# Patient Record
Sex: Female | Born: 1950 | Race: White | Hispanic: No | Marital: Married | Smoking: Never smoker
Health system: Southern US, Community
[De-identification: ages and names within clinical notes are randomized; demographics above are authoritative.]

## PROBLEM LIST (undated history)

## (undated) DIAGNOSIS — E119 Type 2 diabetes mellitus without complications: Secondary | ICD-10-CM

## (undated) DIAGNOSIS — K219 Gastro-esophageal reflux disease without esophagitis: Secondary | ICD-10-CM

## (undated) DIAGNOSIS — C801 Malignant (primary) neoplasm, unspecified: Secondary | ICD-10-CM

## (undated) DIAGNOSIS — I10 Essential (primary) hypertension: Secondary | ICD-10-CM

## (undated) DIAGNOSIS — K7460 Unspecified cirrhosis of liver: Secondary | ICD-10-CM

## (undated) HISTORY — PX: TUBAL LIGATION: SHX77

---

## 2006-07-29 ENCOUNTER — Ambulatory Visit: Payer: Self-pay

## 2007-08-04 ENCOUNTER — Ambulatory Visit: Payer: Self-pay

## 2008-08-08 ENCOUNTER — Ambulatory Visit: Payer: Self-pay | Admitting: Obstetrics and Gynecology

## 2009-09-11 ENCOUNTER — Ambulatory Visit: Payer: Self-pay

## 2010-09-19 ENCOUNTER — Ambulatory Visit: Payer: Self-pay

## 2011-09-23 ENCOUNTER — Ambulatory Visit: Payer: Self-pay

## 2012-09-28 ENCOUNTER — Ambulatory Visit: Payer: Self-pay

## 2012-10-18 ENCOUNTER — Ambulatory Visit: Payer: Self-pay

## 2012-11-11 ENCOUNTER — Ambulatory Visit: Payer: Self-pay | Admitting: Surgery

## 2012-11-11 HISTORY — PX: BREAST BIOPSY: SHX20

## 2012-11-12 LAB — PATHOLOGY REPORT

## 2013-09-29 ENCOUNTER — Ambulatory Visit: Payer: Self-pay

## 2014-09-05 ENCOUNTER — Other Ambulatory Visit: Payer: Self-pay | Admitting: Internal Medicine

## 2014-09-05 DIAGNOSIS — Z139 Encounter for screening, unspecified: Secondary | ICD-10-CM

## 2014-10-03 ENCOUNTER — Ambulatory Visit
Admission: RE | Admit: 2014-10-03 | Discharge: 2014-10-03 | Disposition: A | Discharge status: Home / Self Care | Payer: BLUE CROSS/BLUE SHIELD | Source: Ambulatory Visit | Attending: Internal Medicine | Admitting: Internal Medicine

## 2014-10-03 DIAGNOSIS — Z1231 Encounter for screening mammogram for malignant neoplasm of breast: Secondary | ICD-10-CM | POA: Diagnosis present

## 2014-10-03 DIAGNOSIS — Z139 Encounter for screening, unspecified: Secondary | ICD-10-CM

## 2015-08-23 ENCOUNTER — Other Ambulatory Visit: Payer: Self-pay | Admitting: Internal Medicine

## 2015-09-06 ENCOUNTER — Other Ambulatory Visit: Payer: Self-pay | Admitting: Obstetrics and Gynecology

## 2015-09-06 DIAGNOSIS — Z1231 Encounter for screening mammogram for malignant neoplasm of breast: Secondary | ICD-10-CM

## 2015-10-09 ENCOUNTER — Ambulatory Visit
Admission: RE | Admit: 2015-10-09 | Discharge: 2015-10-09 | Disposition: A | Discharge status: Home / Self Care | Payer: Medicare Other | Source: Ambulatory Visit | Attending: Obstetrics and Gynecology | Admitting: Obstetrics and Gynecology

## 2015-10-09 ENCOUNTER — Other Ambulatory Visit: Payer: Self-pay | Admitting: Obstetrics and Gynecology

## 2015-10-09 DIAGNOSIS — Z1231 Encounter for screening mammogram for malignant neoplasm of breast: Secondary | ICD-10-CM

## 2016-07-17 ENCOUNTER — Other Ambulatory Visit: Payer: Self-pay | Admitting: Internal Medicine

## 2016-07-17 DIAGNOSIS — D72819 Decreased white blood cell count, unspecified: Secondary | ICD-10-CM

## 2016-07-17 DIAGNOSIS — R945 Abnormal results of liver function studies: Secondary | ICD-10-CM

## 2016-07-17 DIAGNOSIS — R7989 Other specified abnormal findings of blood chemistry: Secondary | ICD-10-CM

## 2016-07-17 DIAGNOSIS — D696 Thrombocytopenia, unspecified: Secondary | ICD-10-CM

## 2016-07-17 DIAGNOSIS — E119 Type 2 diabetes mellitus without complications: Secondary | ICD-10-CM

## 2016-07-29 ENCOUNTER — Ambulatory Visit
Admission: RE | Admit: 2016-07-29 | Discharge: 2016-07-29 | Disposition: A | Discharge status: Home / Self Care | Payer: Medicare Other | Source: Ambulatory Visit | Attending: Internal Medicine | Admitting: Internal Medicine

## 2016-07-29 DIAGNOSIS — D72819 Decreased white blood cell count, unspecified: Secondary | ICD-10-CM | POA: Insufficient documentation

## 2016-07-29 DIAGNOSIS — R161 Splenomegaly, not elsewhere classified: Secondary | ICD-10-CM | POA: Diagnosis not present

## 2016-07-29 DIAGNOSIS — R945 Abnormal results of liver function studies: Secondary | ICD-10-CM | POA: Insufficient documentation

## 2016-07-29 DIAGNOSIS — D696 Thrombocytopenia, unspecified: Secondary | ICD-10-CM | POA: Diagnosis not present

## 2016-07-29 DIAGNOSIS — K7460 Unspecified cirrhosis of liver: Secondary | ICD-10-CM | POA: Diagnosis not present

## 2016-07-29 DIAGNOSIS — E119 Type 2 diabetes mellitus without complications: Secondary | ICD-10-CM | POA: Diagnosis present

## 2016-07-29 DIAGNOSIS — R7989 Other specified abnormal findings of blood chemistry: Secondary | ICD-10-CM

## 2016-07-29 DIAGNOSIS — I700 Atherosclerosis of aorta: Secondary | ICD-10-CM | POA: Diagnosis not present

## 2016-07-29 HISTORY — DX: Essential (primary) hypertension: I10

## 2016-07-29 HISTORY — DX: Type 2 diabetes mellitus without complications: E11.9

## 2016-07-29 MED ORDER — IOPAMIDOL (ISOVUE-300) INJECTION 61%
100.0000 mL | Freq: Once | INTRAVENOUS | Status: AC | PRN
  Administered 2016-07-29: 100 mL via INTRAVENOUS

## 2016-09-11 ENCOUNTER — Other Ambulatory Visit: Payer: Self-pay | Admitting: Obstetrics and Gynecology

## 2016-09-11 DIAGNOSIS — Z1231 Encounter for screening mammogram for malignant neoplasm of breast: Secondary | ICD-10-CM

## 2016-10-13 ENCOUNTER — Ambulatory Visit
Admission: RE | Admit: 2016-10-13 | Discharge: 2016-10-13 | Disposition: A | Discharge status: Home / Self Care | Payer: Medicare Other | Source: Ambulatory Visit | Attending: Obstetrics and Gynecology | Admitting: Obstetrics and Gynecology

## 2016-10-13 DIAGNOSIS — Z1231 Encounter for screening mammogram for malignant neoplasm of breast: Secondary | ICD-10-CM | POA: Insufficient documentation

## 2016-10-13 HISTORY — DX: Malignant (primary) neoplasm, unspecified: C80.1

## 2016-10-24 ENCOUNTER — Other Ambulatory Visit: Payer: Self-pay | Admitting: Obstetrics and Gynecology

## 2016-10-24 DIAGNOSIS — R928 Other abnormal and inconclusive findings on diagnostic imaging of breast: Secondary | ICD-10-CM

## 2016-10-28 ENCOUNTER — Ambulatory Visit
Admission: RE | Admit: 2016-10-28 | Discharge: 2016-10-28 | Disposition: A | Discharge status: Home / Self Care | Payer: Medicare Other | Source: Ambulatory Visit | Attending: Obstetrics and Gynecology | Admitting: Obstetrics and Gynecology

## 2016-10-28 DIAGNOSIS — R928 Other abnormal and inconclusive findings on diagnostic imaging of breast: Secondary | ICD-10-CM | POA: Insufficient documentation

## 2016-12-09 ENCOUNTER — Encounter: Payer: Self-pay | Admitting: *Deleted

## 2016-12-10 ENCOUNTER — Encounter: Payer: Self-pay | Admitting: *Deleted

## 2016-12-10 ENCOUNTER — Ambulatory Visit
Admission: RE | Admit: 2016-12-10 | Discharge: 2016-12-10 | Disposition: A | Discharge status: Home / Self Care | Payer: Medicare Other | Source: Ambulatory Visit | Attending: Unknown Physician Specialty | Admitting: Unknown Physician Specialty

## 2016-12-10 ENCOUNTER — Ambulatory Visit: Payer: Medicare Other | Admitting: Anesthesiology

## 2016-12-10 ENCOUNTER — Encounter
Admission: RE | Disposition: A | Discharge status: Home / Self Care | Payer: Self-pay | Source: Ambulatory Visit | Attending: Unknown Physician Specialty

## 2016-12-10 DIAGNOSIS — K648 Other hemorrhoids: Secondary | ICD-10-CM | POA: Insufficient documentation

## 2016-12-10 DIAGNOSIS — K2970 Gastritis, unspecified, without bleeding: Secondary | ICD-10-CM | POA: Insufficient documentation

## 2016-12-10 DIAGNOSIS — E119 Type 2 diabetes mellitus without complications: Secondary | ICD-10-CM | POA: Diagnosis not present

## 2016-12-10 DIAGNOSIS — Z9851 Tubal ligation status: Secondary | ICD-10-CM | POA: Insufficient documentation

## 2016-12-10 DIAGNOSIS — I10 Essential (primary) hypertension: Secondary | ICD-10-CM | POA: Diagnosis not present

## 2016-12-10 DIAGNOSIS — K635 Polyp of colon: Secondary | ICD-10-CM | POA: Insufficient documentation

## 2016-12-10 DIAGNOSIS — Z1211 Encounter for screening for malignant neoplasm of colon: Secondary | ICD-10-CM | POA: Diagnosis not present

## 2016-12-10 DIAGNOSIS — Z9889 Other specified postprocedural states: Secondary | ICD-10-CM | POA: Insufficient documentation

## 2016-12-10 DIAGNOSIS — Z7984 Long term (current) use of oral hypoglycemic drugs: Secondary | ICD-10-CM | POA: Diagnosis not present

## 2016-12-10 DIAGNOSIS — Z79899 Other long term (current) drug therapy: Secondary | ICD-10-CM | POA: Diagnosis not present

## 2016-12-10 DIAGNOSIS — Z8589 Personal history of malignant neoplasm of other organs and systems: Secondary | ICD-10-CM | POA: Insufficient documentation

## 2016-12-10 DIAGNOSIS — I8500 Esophageal varices without bleeding: Secondary | ICD-10-CM | POA: Insufficient documentation

## 2016-12-10 DIAGNOSIS — K7460 Unspecified cirrhosis of liver: Secondary | ICD-10-CM | POA: Insufficient documentation

## 2016-12-10 HISTORY — PX: ESOPHAGOGASTRODUODENOSCOPY (EGD) WITH PROPOFOL: SHX5813

## 2016-12-10 HISTORY — PX: COLONOSCOPY WITH PROPOFOL: SHX5780

## 2016-12-10 HISTORY — DX: Unspecified cirrhosis of liver: K74.60

## 2016-12-10 LAB — GLUCOSE, CAPILLARY: GLUCOSE-CAPILLARY: 118 mg/dL — AB (ref 65–99)

## 2016-12-10 SURGERY — ESOPHAGOGASTRODUODENOSCOPY (EGD) WITH PROPOFOL
Anesthesia: General

## 2016-12-10 MED ORDER — PROPOFOL 10 MG/ML IV BOLUS
INTRAVENOUS | Status: DC | PRN
  Administered 2016-12-10: 30 mg via INTRAVENOUS

## 2016-12-10 MED ORDER — PHENYLEPHRINE HCL 10 MG/ML IJ SOLN
INTRAMUSCULAR | Status: AC
  Filled 2016-12-10: qty 1

## 2016-12-10 MED ORDER — FENTANYL CITRATE (PF) 100 MCG/2ML IJ SOLN
INTRAMUSCULAR | Status: DC | PRN
  Administered 2016-12-10 (×2): 50 ug via INTRAVENOUS

## 2016-12-10 MED ORDER — MIDAZOLAM HCL 2 MG/2ML IJ SOLN
INTRAMUSCULAR | Status: DC | PRN
  Administered 2016-12-10: 2 mg via INTRAVENOUS

## 2016-12-10 MED ORDER — PROPOFOL 500 MG/50ML IV EMUL
INTRAVENOUS | Status: AC
  Filled 2016-12-10: qty 50

## 2016-12-10 MED ORDER — MIDAZOLAM HCL 2 MG/2ML IJ SOLN
INTRAMUSCULAR | Status: AC
  Filled 2016-12-10: qty 2

## 2016-12-10 MED ORDER — FENTANYL CITRATE (PF) 100 MCG/2ML IJ SOLN
INTRAMUSCULAR | Status: AC
  Filled 2016-12-10: qty 2

## 2016-12-10 MED ORDER — PROPOFOL 500 MG/50ML IV EMUL
INTRAVENOUS | Status: DC | PRN
  Administered 2016-12-10: 120 ug/kg/min via INTRAVENOUS

## 2016-12-10 MED ORDER — LIDOCAINE HCL (PF) 2 % IJ SOLN
INTRAMUSCULAR | Status: AC
  Filled 2016-12-10: qty 10

## 2016-12-10 MED ORDER — SODIUM CHLORIDE 0.9 % IV SOLN: INTRAVENOUS | Status: DC

## 2016-12-10 MED ORDER — PHENYLEPHRINE HCL 10 MG/ML IJ SOLN
INTRAMUSCULAR | Status: DC | PRN
  Administered 2016-12-10: 100 ug via INTRAVENOUS

## 2016-12-10 NOTE — Anesthesia Post-op Follow-up Note (Signed)
Anesthesia QCDR form completed.        

## 2016-12-10 NOTE — H&P (Signed)
Primary Care Physician:  Tracie Harrier, MD Primary Gastroenterologist:  Dr. Vira Agar  Pre-Procedure History & Physical: HPI:  Deanna Escobar is a 66 y.o. female is here for an endoscopy and colonoscopy.   Past Medical History:  Diagnosis Date  . Cancer ([place])    skin ca  . Cirrhosis, non-alcoholic ([place])   . Diabetes mellitus without complication ([place])    Pt takes Metformin.  Marland Kitchen Hypertension     Past Surgical History:  Procedure Laterality Date  . BREAST BIOPSY Right 11/11/12   bx/clip-neg  . TUBAL LIGATION      Prior to Admission medications   Medication Sig Start Date End Date Taking? Authorizing Provider  alendronate (FOSAMAX) 70 MG tablet Take 70 mg by mouth once a week. Take with a full glass of water on an empty stomach.   Yes [provider]  Cholecalciferol 2000 units TABS Take by mouth.   Yes [provider]  citalopram (CELEXA) 20 MG tablet Take 20 mg by mouth daily.   Yes [provider]  clobetasol ointment (TEMOVATE) 7.04 % Apply 1 application topically 2 (two) times daily.   Yes [provider]  furosemide (LASIX) 20 MG tablet Take 20 mg by mouth.   Yes [provider]  gentamicin ointment (GARAMYCIN) 0.1 % Apply 1 application topically 3 (three) times daily.   Yes [provider]  glimepiride (AMARYL) 2 MG tablet Take 2 mg by mouth daily with breakfast.   Yes [provider]  HM OMEGA-3-6-9 FATTY ACIDS CAPS Take by mouth.   Yes [provider]  hydroxychloroquine (PLAQUENIL) 200 MG tablet Take by mouth daily.   Yes [provider]  lisinopril (PRINIVIL,ZESTRIL) 20 MG tablet Take 20 mg by mouth daily.   Yes [provider]  metFORMIN (GLUCOPHAGE) 500 MG tablet Take by mouth 2 (two) times daily with a meal.   Yes [provider]  vitamin B-12 (CYANOCOBALAMIN) 1000 MCG tablet Take 1,000 mcg by mouth daily.   Yes [provider]    Allergies as of 09/18/2016   . (No Known Allergies)    Family History  Problem Relation Age of Onset  . Breast cancer Neg Hx     Social History   Social History  . Marital status: Married    Spouse name: N/A  . Number of children: N/A  . Years of education: N/A   Occupational History  . Not on file.   Social History Main Topics  . Smoking status: Never Smoker  . Smokeless tobacco: Never Used  . Alcohol use No  . Drug use: No  . Sexual activity: Not on file   Other Topics Concern  . Not on file   Social History Narrative  . No narrative on file    Review of Systems: See HPI, otherwise negative ROS  Physical Exam: BP (!) 131/54   Pulse 80   Temp 97.9 F (36.6 C) (Tympanic)   Resp 18   Ht 5' 1"  (1.549 m)   Wt 81.6 kg (180 lb)   SpO2 99%   BMI 34.01 kg/m  General:   Alert,  pleasant and cooperative in NAD Head:  Normocephalic and atraumatic. Neck:  Supple; no masses or thyromegaly. Lungs:  Clear throughout to auscultation.    Heart:  Regular rate and rhythm. Abdomen:  Soft, nontender and nondistended. Normal bowel sounds, without guarding, and without rebound.   Neurologic:  Alert and  oriented x4;  grossly normal neurologically. SKIN with spider  angioma.  Impression/Plan: Deanna Escobar is here for an endoscopy and colonoscopy to be performed for colon cancer screening and EGD to screen for varices.  Risks, benefits, limitations, and alternatives regarding  endoscopy and colonoscopy have been reviewed with the patient.  Questions have been answered.  All parties agreeable.   Gaylyn Cheers, MD  12/10/2016, 7:30 AM

## 2016-12-10 NOTE — Op Note (Signed)
[facility] Gastroenterology Patient Name: Deanna Escobar Procedure Date: 12/10/2016 7:30 AM MRN: [id number] Account #: [id number] Date of Birth: 1951-02-18 Admit Type: Outpatient Age: 66 Room: [facility] ENDO ROOM 3 Gender: Female Note Status: Finalized Procedure:            Colonoscopy Indications:          Screening for colorectal malignant neoplasm Providers:            Manya Silvas, MD Referring MD:         Tracie Harrier, MD (Referring MD) Medicines:            Propofol per Anesthesia Complications:        No immediate complications. Procedure:            Pre-Anesthesia Assessment:                       - After reviewing the risks and benefits, the patient                        was deemed in satisfactory condition to undergo the                        procedure.                       After obtaining informed consent, the colonoscope was                        passed under direct vision. Throughout the procedure,                        the patient's blood pressure, pulse, and oxygen                        saturations were monitored continuously. The                        Colonoscope was introduced through the anus and                        advanced to the the cecum, identified by appendiceal                        orifice and ileocecal valve. The colonoscopy was                        performed without difficulty. The patient tolerated the                        procedure well. The quality of the bowel preparation                        was good. Findings:      A diminutive polyp was found in the cecum. The polyp was sessile. The       polyp was removed with a cold biopsy forceps. Resection and retrieval       were complete.      Internal hemorrhoids were found during endoscopy. The hemorrhoids were       small and Grade I (internal hemorrhoids that do not prolapse).      The exam was otherwise  without abnormality. Impression:           - One diminutive  polyp in the cecum, removed with a                        cold biopsy forceps. Resected and retrieved.                       - Internal hemorrhoids.                       - The examination was otherwise normal. Recommendation:       - Await pathology results. Manya Silvas, MD 12/10/2016 8:13:39 AM This report has been signed electronically. Number of Addenda: 0 Note Initiated On: 12/10/2016 7:30 AM Scope Withdrawal Time: 0 hours 13 minutes 42 seconds  Total Procedure Duration: 0 hours 21 minutes 14 seconds       [facility]

## 2016-12-10 NOTE — Transfer of Care (Signed)
Immediate Anesthesia Transfer of Care Note  Patient: Deanna Escobar  Procedure(s) Performed: ESOPHAGOGASTRODUODENOSCOPY (EGD) WITH PROPOFOL (N/A ) COLONOSCOPY WITH PROPOFOL (N/A )  Patient Location: PACU  Anesthesia Type:General  Level of Consciousness: awake  Airway & Oxygen Therapy: Patient Spontanous Breathing and Patient connected to nasal cannula oxygen  Post-op Assessment: Report given to RN and Post -op Vital signs reviewed and stable  Post vital signs: Reviewed  Last Vitals:  Vitals:   12/10/16 0652  BP: (!) 131/54  Pulse: 80  Resp: 18  Temp: 36.6 C  SpO2: 99%    Last Pain:  Vitals:   12/10/16 0652  TempSrc: Tympanic         Complications: No apparent anesthesia complications

## 2016-12-10 NOTE — Op Note (Signed)
[facility] Gastroenterology Patient Name: Deanna Escobar Procedure Date: 12/10/2016 7:31 AM MRN: [id number] Account #: [id number] Date of Birth: [birth year] Admit Type: Outpatient Age: 66 Room: [facility] ENDO ROOM 3 Gender: Female Note Status: Finalized Procedure:            Upper GI endoscopy Indications:          Cirrhosis with suspected esophageal varices Providers:            Manya Silvas, MD Referring MD:         Tracie Harrier, MD (Referring MD) Medicines:            Propofol per Anesthesia Complications:        No immediate complications. Procedure:            Pre-Anesthesia Assessment:                       - After reviewing the risks and benefits, the patient                        was deemed in satisfactory condition to undergo the                        procedure.                       After obtaining informed consent, the endoscope was                        passed under direct vision. Throughout the procedure,                        the patient's blood pressure, pulse, and oxygen                        saturations were monitored continuously. The Endoscope                        was introduced through the mouth, and advanced to the                        second part of duodenum. The upper GI endoscopy was                        accomplished without difficulty. The patient tolerated                        the procedure well. Findings:      Grade 1-2 varices were found in the lower third of the esophagus. No       evidence of bleeding.      Patchy moderate inflammation characterized by erosions was found in the       gastric antrum.      Patchy mildly erythematous mucosa without bleeding was found in the       gastric antrum.      The examined duodenum was normal. Impression:           - Grade II esophageal varices.                       - Gastritis.                       -  Erythematous mucosa in the antrum.                       - Normal examined  duodenum.                       - No specimens collected. Recommendation:       Start PPI like Omeprazole and consider beta blocker                        prophylaxis.                       - The findings and recommendations were discussed with                        the patient's family. Manya Silvas, MD 12/10/2016 7:47:54 AM This report has been signed electronically. Number of Addenda: 0 Note Initiated On: 12/10/2016 7:31 AM      [facility]

## 2016-12-10 NOTE — Anesthesia Postprocedure Evaluation (Signed)
Anesthesia Post Note  Patient: JENNEFER KOPP  Procedure(s) Performed: ESOPHAGOGASTRODUODENOSCOPY (EGD) WITH PROPOFOL (N/A ) COLONOSCOPY WITH PROPOFOL (N/A )  Patient location during evaluation: PACU Anesthesia Type: General Level of consciousness: awake Pain management: pain level controlled Vital Signs Assessment: post-procedure vital signs reviewed and stable Respiratory status: nonlabored ventilation Cardiovascular status: stable Anesthetic complications: no     Last Vitals:  Vitals:   12/10/16 0834 12/10/16 0844  BP: (!) 113/55 118/60  Pulse: 82 71  Resp: 15 12  Temp:    SpO2: 99% 99%    Last Pain:  Vitals:   12/10/16 0814  TempSrc: Tympanic                 VAN STAVEREN,Taylee Gunnells

## 2016-12-10 NOTE — Anesthesia Preprocedure Evaluation (Signed)
Anesthesia Evaluation  Patient identified by MRN, date of birth, ID band Patient awake    Reviewed: Allergy & Precautions, NPO status , Patient's Chart, lab work & pertinent test results  Airway Mallampati: II       Dental  (+) Teeth Intact   Pulmonary neg pulmonary ROS,    breath sounds clear to auscultation       Cardiovascular Exercise Tolerance: Good hypertension, Pt. on medications  Rhythm:Regular Rate:Normal     Neuro/Psych negative neurological ROS     GI/Hepatic negative GI ROS, Neg liver ROS, (+) Cirrhosis       ,   Endo/Other  diabetes, Type 2, Oral Hypoglycemic Agents  Renal/GU negative Renal ROS  negative genitourinary   Musculoskeletal negative musculoskeletal ROS (+)   Abdominal Normal abdominal exam  (+)   Peds negative pediatric ROS (+)  Hematology negative hematology ROS (+)   Anesthesia Other Findings   Reproductive/Obstetrics                             Anesthesia Physical Anesthesia Plan  ASA: II  Anesthesia Plan: General   Post-op Pain Management:    Induction: Intravenous  PONV Risk Score and Plan: 0  Airway Management Planned: Natural Airway and Nasal Cannula  Additional Equipment:   Intra-op Plan:   Post-operative Plan:   Informed Consent: I have reviewed the patients History and Physical, chart, labs and discussed the procedure including the risks, benefits and alternatives for the proposed anesthesia with the patient or authorized representative who has indicated his/her understanding and acceptance.     Plan Discussed with: Surgeon  Anesthesia Plan Comments:         Anesthesia Quick Evaluation

## 2016-12-11 ENCOUNTER — Encounter: Payer: Self-pay | Admitting: Unknown Physician Specialty

## 2016-12-11 LAB — SURGICAL PATHOLOGY

## 2017-02-07 HISTORY — PX: LIVER BIOPSY: SHX301

## 2017-02-11 ENCOUNTER — Other Ambulatory Visit: Payer: Self-pay | Admitting: Student

## 2017-02-11 DIAGNOSIS — K7460 Unspecified cirrhosis of liver: Secondary | ICD-10-CM

## 2017-02-19 ENCOUNTER — Ambulatory Visit: Payer: Medicare Other

## 2017-02-24 ENCOUNTER — Other Ambulatory Visit: Payer: Self-pay | Admitting: Radiology

## 2017-02-25 ENCOUNTER — Ambulatory Visit
Admission: RE | Admit: 2017-02-25 | Discharge: 2017-02-25 | Disposition: A | Discharge status: Home / Self Care | Payer: Medicare Other | Source: Ambulatory Visit | Attending: Student | Admitting: Student

## 2017-02-25 DIAGNOSIS — Z7984 Long term (current) use of oral hypoglycemic drugs: Secondary | ICD-10-CM | POA: Insufficient documentation

## 2017-02-25 DIAGNOSIS — K7460 Unspecified cirrhosis of liver: Secondary | ICD-10-CM | POA: Diagnosis present

## 2017-02-25 DIAGNOSIS — E119 Type 2 diabetes mellitus without complications: Secondary | ICD-10-CM | POA: Insufficient documentation

## 2017-02-25 DIAGNOSIS — I10 Essential (primary) hypertension: Secondary | ICD-10-CM | POA: Insufficient documentation

## 2017-02-25 DIAGNOSIS — R945 Abnormal results of liver function studies: Secondary | ICD-10-CM | POA: Insufficient documentation

## 2017-02-25 DIAGNOSIS — K219 Gastro-esophageal reflux disease without esophagitis: Secondary | ICD-10-CM | POA: Diagnosis not present

## 2017-02-25 DIAGNOSIS — Z85828 Personal history of other malignant neoplasm of skin: Secondary | ICD-10-CM | POA: Insufficient documentation

## 2017-02-25 DIAGNOSIS — Z79899 Other long term (current) drug therapy: Secondary | ICD-10-CM | POA: Insufficient documentation

## 2017-02-25 DIAGNOSIS — K760 Fatty (change of) liver, not elsewhere classified: Secondary | ICD-10-CM | POA: Diagnosis not present

## 2017-02-25 HISTORY — DX: Gastro-esophageal reflux disease without esophagitis: K21.9

## 2017-02-25 LAB — PROTIME-INR
INR: 1.23
PROTHROMBIN TIME: 15.4 s — AB (ref 11.4–15.2)

## 2017-02-25 LAB — CBC
HCT: 38.1 % (ref 35.0–47.0)
HEMOGLOBIN: 12.8 g/dL (ref 12.0–16.0)
MCH: 33.1 pg (ref 26.0–34.0)
MCHC: 33.5 g/dL (ref 32.0–36.0)
MCV: 98.9 fL (ref 80.0–100.0)
Platelets: 74 10*3/uL — ABNORMAL LOW (ref 150–440)
RBC: 3.86 MIL/uL (ref 3.80–5.20)
RDW: 14.6 % — ABNORMAL HIGH (ref 11.5–14.5)
WBC: 2.1 10*3/uL — ABNORMAL LOW (ref 3.6–11.0)

## 2017-02-25 LAB — GLUCOSE, CAPILLARY: GLUCOSE-CAPILLARY: 122 mg/dL — AB (ref 65–99)

## 2017-02-25 LAB — APTT: aPTT: 35 seconds (ref 24–36)

## 2017-02-25 MED ORDER — MIDAZOLAM HCL 5 MG/5ML IJ SOLN
INTRAMUSCULAR | Status: AC
  Filled 2017-02-25: qty 5

## 2017-02-25 MED ORDER — MIDAZOLAM HCL 5 MG/5ML IJ SOLN
INTRAMUSCULAR | Status: AC | PRN
  Administered 2017-02-25: 0.5 mg via INTRAVENOUS
  Administered 2017-02-25: 1 mg via INTRAVENOUS

## 2017-02-25 MED ORDER — SODIUM CHLORIDE 0.9 % IV SOLN
INTRAVENOUS | Status: DC
  Administered 2017-02-25: 09:00:00 via INTRAVENOUS

## 2017-02-25 MED ORDER — FENTANYL CITRATE (PF) 100 MCG/2ML IJ SOLN
INTRAMUSCULAR | Status: AC
  Filled 2017-02-25: qty 4

## 2017-02-25 MED ORDER — FENTANYL CITRATE (PF) 100 MCG/2ML IJ SOLN
INTRAMUSCULAR | Status: AC | PRN
  Administered 2017-02-25: 50 ug via INTRAVENOUS

## 2017-02-25 MED ORDER — OXYCODONE HCL 5 MG PO TABS
5.0000 mg | ORAL_TABLET | ORAL | Status: DC | PRN
  Filled 2017-02-25: qty 1

## 2017-02-25 NOTE — Procedures (Signed)
US guided random liver biopsy.  3 cores from left hepatic lobe.  Minimal blood loss and no immediate complication.  Bedrest 3 hours.

## 2017-02-25 NOTE — Consult Note (Signed)
Chief Complaint: Patient was seen in consultation today for image guided liver biopsy at the request of [place] [facility]  Referring Physician(s): Johnson,Michelle C  Patient Status: [facility] - Out-pt  History of Present Illness: Deanna Escobar is a 66 y.o. female with diabetes and history of abnormal LFTs. Patient had a CT of the abdomen on 07/29/2016 that demonstrated cirrhosis and mild splenomegaly. Patient presents for additional evaluation of her liver disease. Patient is asymptomatic. She has no complaints today.  Past Medical History:  Diagnosis Date  . Cancer ([place])    skin ca  . Cirrhosis, non-alcoholic ([place])   . Diabetes mellitus without complication ([place])    Pt takes Metformin.  Marland Kitchen GERD (gastroesophageal reflux disease)   . Hypertension     Past Surgical History:  Procedure Laterality Date  . BREAST BIOPSY Right 11/11/12   bx/clip-neg  . COLONOSCOPY WITH PROPOFOL N/A 12/10/2016   Procedure: COLONOSCOPY WITH PROPOFOL;  Surgeon: Manya Silvas, MD;  Location: [facility] ENDOSCOPY;  Service: Endoscopy;  Laterality: N/A;  . ESOPHAGOGASTRODUODENOSCOPY (EGD) WITH PROPOFOL N/A 12/10/2016   Procedure: ESOPHAGOGASTRODUODENOSCOPY (EGD) WITH PROPOFOL;  Surgeon: Manya Silvas, MD;  Location: [facility] ENDOSCOPY;  Service: Endoscopy;  Laterality: N/A;  . TUBAL LIGATION      Allergies: Patient has no known allergies.  Medications: Prior to Admission medications   Medication Sig Start Date End Date Taking? Authorizing Provider  citalopram (CELEXA) 20 MG tablet Take 20 mg by mouth daily.   Yes [provider]  clobetasol ointment (TEMOVATE) 3.01 % Apply 1 application topically 2 (two) times daily.   Yes [provider]  furosemide (LASIX) 20 MG tablet Take 20 mg by mouth.   Yes [provider]  gentamicin ointment (GARAMYCIN) 0.1 % Apply 1 application topically 3 (three) times daily.   Yes [provider]  glimepiride (AMARYL) 2 MG tablet Take 2 mg  by mouth daily with breakfast.   Yes [provider]  HM OMEGA-3-6-9 FATTY ACIDS CAPS Take by mouth.   Yes [provider]  hydroxychloroquine (PLAQUENIL) 200 MG tablet Take by mouth daily.   Yes [provider]  lisinopril (PRINIVIL,ZESTRIL) 20 MG tablet Take 20 mg by mouth daily.   Yes [provider]  metFORMIN (GLUCOPHAGE) 500 MG tablet Take by mouth 2 (two) times daily with a meal.   Yes [provider]  vitamin B-12 (CYANOCOBALAMIN) 1000 MCG tablet Take 1,000 mcg by mouth daily.   Yes [provider]  alendronate (FOSAMAX) 70 MG tablet Take 70 mg by mouth once a week. Take with a full glass of water on an empty stomach.    [provider]  Cholecalciferol 2000 units TABS Take by mouth.    [provider]     Family History  Problem Relation Age of Onset  . Breast cancer Neg Hx     Social History   Socioeconomic History  . Marital status: Married    Spouse name: None  . Number of children: None  . Years of education: None  . Highest education level: None  Social Needs  . Financial resource strain: None  . Food insecurity - worry: None  . Food insecurity - inability: None  . Transportation needs - medical: None  . Transportation needs - non-medical: None  Occupational History  . None  Tobacco Use  . Smoking status: Never Smoker  . Smokeless tobacco: Never Used  Substance and Sexual Activity  . Alcohol use: No  . Drug use:  No  . Sexual activity: None  Other Topics Concern  . None  Social History Narrative  . None      Review of Systems: A 12 point ROS discussed and pertinent positives are indicated in the HPI above.  All other systems are negative.  Review of Systems  Constitutional: Negative.   Respiratory: Negative.   Cardiovascular: Negative.   Gastrointestinal: Negative.   Genitourinary: Negative.     Vital Signs: BP (!) 122/56   Pulse 83   Temp 98.8 F (37.1 C) (Oral)   Resp 14    Ht 5' 1.5" (1.562 m)   Wt 160 lb (72.6 kg)   SpO2 96%   BMI 29.74 kg/m   Physical Exam  Constitutional: No distress.  HENT:  Mouth/Throat: Oropharynx is clear and moist.  Cardiovascular: Normal rate, regular rhythm and normal heart sounds.  Pulmonary/Chest: Effort normal and breath sounds normal.  Abdominal: Soft. Bowel sounds are normal.    Imaging: No results found.  Labs:  CBC: Recent Labs    02/25/17 0807  WBC 2.1*  HGB 12.8  HCT 38.1  PLT 74*    COAGS: Recent Labs    02/25/17 0807  INR 1.23  APTT 35    BMP: No results for input(s): NA, K, CL, CO2, GLUCOSE, BUN, CALCIUM, CREATININE, GFRNONAA, GFRAA in the last 8760 hours.  Invalid input(s): CMP  LIVER FUNCTION TESTS: No results for input(s): BILITOT, AST, ALT, ALKPHOS, PROT, ALBUMIN in the last 8760 hours.  TUMOR MARKERS: No results for input(s): AFPTM, CEA, CA199, CHROMGRNA in the last 8760 hours.  Assessment and Plan:  66 year old female with evidence for cirrhosis based on prior imaging. Patient presents for an ultrasound-guided liver biopsy. The liver biopsy was discussed with the patient. Patient's labs demonstrate a low platelet count at 74,000. Although the platelet count is low it should be safe for percutaneous biopsy. The risk of bleeding has been discussed with the patient. Informed consent was obtained from the patient. Plan for ultrasound-guided random liver biopsy with moderate sedation.  Thank you for this interesting consult.  I greatly enjoyed meeting Deanna Escobar and look forward to participating in their care.  A copy of this report was sent to the requesting provider on this date.  Electronically Signed: Burman Riis, MD 02/25/2017, 8:52 AM   I spent a total of  15 Minutes   in face to face in clinical consultation, greater than 50% of which was counseling/coordinating care for US guided liver biopsy.

## 2017-03-02 LAB — SURGICAL PATHOLOGY

## 2017-03-16 ENCOUNTER — Other Ambulatory Visit
Admission: RE | Admit: 2017-03-16 | Discharge: 2017-03-16 | Disposition: A | Discharge status: Home / Self Care | Payer: Medicare Other | Source: Ambulatory Visit | Attending: Student | Admitting: Student

## 2017-03-16 DIAGNOSIS — K7460 Unspecified cirrhosis of liver: Secondary | ICD-10-CM | POA: Insufficient documentation

## 2017-03-16 DIAGNOSIS — R945 Abnormal results of liver function studies: Secondary | ICD-10-CM | POA: Diagnosis present

## 2017-03-16 LAB — AMMONIA: Ammonia: 29 umol/L (ref 9–35)

## 2017-03-18 ENCOUNTER — Other Ambulatory Visit: Payer: Self-pay | Admitting: Student

## 2017-03-18 DIAGNOSIS — K7460 Unspecified cirrhosis of liver: Secondary | ICD-10-CM

## 2017-03-30 ENCOUNTER — Inpatient Hospital Stay: Payer: Medicare Other | Attending: Hematology and Oncology | Admitting: Hematology and Oncology

## 2017-03-30 ENCOUNTER — Encounter: Payer: Self-pay | Admitting: Hematology and Oncology

## 2017-03-30 ENCOUNTER — Ambulatory Visit
Admission: RE | Admit: 2017-03-30 | Discharge: 2017-03-30 | Disposition: A | Discharge status: Home / Self Care | Payer: Medicare Other | Source: Ambulatory Visit | Attending: Student | Admitting: Student

## 2017-03-30 ENCOUNTER — Other Ambulatory Visit: Payer: Self-pay

## 2017-03-30 ENCOUNTER — Inpatient Hospital Stay: Payer: Medicare Other

## 2017-03-30 VITALS — BP 116/60 | HR 67 | Temp 97.5°F | Ht 62.0 in | Wt 158.4 lb

## 2017-03-30 DIAGNOSIS — K7469 Other cirrhosis of liver: Secondary | ICD-10-CM

## 2017-03-30 DIAGNOSIS — Z79899 Other long term (current) drug therapy: Secondary | ICD-10-CM | POA: Diagnosis not present

## 2017-03-30 DIAGNOSIS — D61818 Other pancytopenia: Secondary | ICD-10-CM | POA: Diagnosis not present

## 2017-03-30 DIAGNOSIS — D508 Other iron deficiency anemias: Secondary | ICD-10-CM

## 2017-03-30 DIAGNOSIS — K7460 Unspecified cirrhosis of liver: Secondary | ICD-10-CM | POA: Diagnosis not present

## 2017-03-30 DIAGNOSIS — E611 Iron deficiency: Secondary | ICD-10-CM | POA: Diagnosis not present

## 2017-03-30 DIAGNOSIS — R161 Splenomegaly, not elsewhere classified: Secondary | ICD-10-CM | POA: Diagnosis not present

## 2017-03-30 DIAGNOSIS — R5383 Other fatigue: Secondary | ICD-10-CM

## 2017-03-30 LAB — CBC WITH DIFFERENTIAL/PLATELET
BASOS ABS: 0 10*3/uL (ref 0–0.1)
BASOS PCT: 1 %
EOS PCT: 1 %
Eosinophils Absolute: 0 10*3/uL (ref 0–0.7)
HCT: 38.4 % (ref 35.0–47.0)
Hemoglobin: 12.7 g/dL (ref 12.0–16.0)
Lymphocytes Relative: 39 %
Lymphs Abs: 1.3 10*3/uL (ref 1.0–3.6)
MCH: 33.1 pg (ref 26.0–34.0)
MCHC: 33.2 g/dL (ref 32.0–36.0)
MCV: 99.8 fL (ref 80.0–100.0)
Monocytes Absolute: 0.3 10*3/uL (ref 0.2–0.9)
Monocytes Relative: 9 %
NEUTROS PCT: 50 %
Neutro Abs: 1.6 10*3/uL (ref 1.4–6.5)
PLATELETS: 84 10*3/uL — AB (ref 150–440)
RBC: 3.84 MIL/uL (ref 3.80–5.20)
RDW: 15.1 % — AB (ref 11.5–14.5)
WBC: 3.2 10*3/uL — ABNORMAL LOW (ref 3.6–11.0)

## 2017-03-30 LAB — RETICULOCYTES
RBC.: 3.84 MIL/uL (ref 3.80–5.20)
Retic Count, Absolute: 30.7 10*3/uL (ref 19.0–183.0)
Retic Ct Pct: 0.8 % (ref 0.4–3.1)

## 2017-03-30 LAB — T4, FREE: Free T4: 0.77 ng/dL (ref 0.61–1.12)

## 2017-03-30 LAB — TECHNOLOGIST SMEAR REVIEW

## 2017-03-30 LAB — POCT I-STAT CREATININE: Creatinine, Ser: 0.6 mg/dL (ref 0.44–1.00)

## 2017-03-30 MED ORDER — GADOBENATE DIMEGLUMINE 529 MG/ML IV SOLN
15.0000 mL | Freq: Once | INTRAVENOUS | Status: AC | PRN
  Administered 2017-03-30: 15 mL via INTRAVENOUS

## 2017-03-30 NOTE — Progress Notes (Signed)
Patient here today as a new patient  

## 2017-03-30 NOTE — Progress Notes (Signed)
[facility] day:  03/30/2017  Chief Complaint: Deanna Escobar is a 67 y.o. female with pancytopenia who is referred in consultation by Tammi Klippel, PA for assessment and management.  HPI:  She notes a history of cirrhosis since 07/2016.  Diagnosis was made after abnormal blood tests.  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  Work-up on 08/19/2016 was normal.  She is Child-Pugh class A (5) and MELD 10.  Liver biopsy on 02/25/2017 revealed at least bridging stage III/cannot exclude stage IV fibrosis, steatosis involving 20% of the parenchyma.  Abdomen and pelvic CT on 07/29/2016 revealed a cirrhotic liver with mild splenomegaly (13.2 cm).  There were a few scattered collateral in the abdomen and question esophageal varices.    Variceal screening on 12/10/2016 revealed grade I-II esophageal varices and gastritis.  Colonoscopy on 12/10/2016 revealed a diminutive hyperplastic polyp in the cecum and internal hemorrhoids.  Available labs reveal the following: 03/16/2017: hematocrit of 37.4, hemoglobin 12.1, MCV 102.2, platelets 84,000, and WBC 3100. 02/25/2017: hematocrit of 38.1, hemoglobin 12.8, MCV 98.9, and platelets 74,000.  PT was 15.4 with an INR of 1.23.  PTT was 35. 08/19/2016: hematocrit of 37.0, hemoglobin 12.2, MCV 100.3, platelets 79,000, and WBC 2500.  AFP was 5.6. 01/04/2016: hematocrit of 37.8, hemoglobin 12.6, MCV 98.7, platelets 103,000, and WBC 3300. 12/28/2013: hematocrit of 39.9, hemoglobin 13.2, MCV 98.3, platelets 135,000, and WBC 3600.  Labs on 03/18/2017:  Ferritin was 15.  Iron saturation was 13% with a TIBC of 495.5.  Folate was 11.3 and B12 > 1500.  Retic was 0.047.  TSH was 0.609. Hepatitis A IgM, hepatitis B core IgM, hepatitis C antibody were negative on 01/04/2016.   Hepatitis B surface antigen was negative on 08/19/2016.  Hepatitis C was <  0.1 on 01/29/2017.  Platelet count was 70,000 on 02/13/2017, 93,000 on 07/09/2016.  Symptomatically, she has been fatigued for > 1 year.  Her family notes some intermittent confusion. Ammonia was checked on 03/16/2017 and was normal at 29 (9 - 35).  She has a cough initially attributed to her antihypertensive medication.  Medication was changed, however the cough has not resolved.   She is eating "ok".  She notes that she has intentionally weight in the past year.  Patient's daughter states, "she is not telling you the whole truth. She is losing weight. She has lost about 15 pounds over the last few months. She was not trying".  Patient rarely eats meat.  She states, "I could really become a vegetarian".  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 She notes that she has "always liked to eat ice" (20 years).  She denies restless legs symptoms.  Her legs cramp often.  She has diarrhea up to 4 times a day after eating.  She is up-to-date on her mammogram.  She has osteoporosis with loss of height.  She complaints of significant back pain that intermittently causes difficulties with her sleep. Patient denies bleeding; no hematochezia, melena, or vaginal bleeding.   She denies any family history significant for any type of oncological or hematological disorders. She has never had a blood transfusion. Patient denies any new medications or the use of herbal products.    Past Medical History:  Diagnosis Date  . Cancer ([place])    skin ca  . Cirrhosis, non-alcoholic ([place])   . Diabetes mellitus without complication ([place])  Pt takes Metformin.  Marland Kitchen GERD (gastroesophageal reflux disease)   . Hypertension     Past Surgical History:  Procedure Laterality Date  . BREAST BIOPSY Right 11/11/12   bx/clip-neg  . COLONOSCOPY WITH PROPOFOL N/A 12/10/2016   Procedure: COLONOSCOPY WITH PROPOFOL;  Surgeon: Manya Silvas, MD;  Location: [facility] ENDOSCOPY;  Service: Endoscopy;  Laterality: N/A;  . ESOPHAGOGASTRODUODENOSCOPY (EGD)  WITH PROPOFOL N/A 12/10/2016   Procedure: ESOPHAGOGASTRODUODENOSCOPY (EGD) WITH PROPOFOL;  Surgeon: Manya Silvas, MD;  Location: [facility] ENDOSCOPY;  Service: Endoscopy;  Laterality: N/A;  . LIVER BIOPSY  02/2017  . TUBAL LIGATION      Family History  Problem Relation Age of Onset  . Breast cancer Neg Hx     Social History:  reports that  has never smoked. she has never used smokeless tobacco. She reports that she does not drink alcohol or use drugs.  Patient does not smoke or drink alcohol. Patient denies known exposures to radiation on toxins. 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 The patient is accompanied by daughter Bryna Colander) and her husband Rush Landmark) today.  Allergies: No Known Allergies  Current Medications: Current Outpatient Medications  Medication Sig Dispense Refill  . alendronate (FOSAMAX) 70 MG tablet Take 70 mg by mouth once a week. Take with a full glass of water on an empty stomach.    . candesartan (ATACAND) 16 MG tablet Take 16 mg by mouth daily.  0  . Cholecalciferol 2000 units TABS Take by mouth.    . citalopram (CELEXA) 20 MG tablet Take 20 mg by mouth daily.    . clobetasol ointment (TEMOVATE) 7.51 % Apply 1 application topically 2 (two) times daily.    . furosemide (LASIX) 20 MG tablet Take 20 mg by mouth.    Marland Kitchen glimepiride (AMARYL) 2 MG tablet Take 2 mg by mouth daily with breakfast.    . HM OMEGA-3-6-9 FATTY ACIDS CAPS Take by mouth.    . hydroxychloroquine (PLAQUENIL) 200 MG tablet Take by mouth daily.    Marland Kitchen lisinopril (PRINIVIL,ZESTRIL) 20 MG tablet Take 20 mg by mouth daily.    . metFORMIN (GLUCOPHAGE) 500 MG tablet Take 1,000 mg by mouth 2 (two) times daily with a meal.     . omeprazole (PRILOSEC) 40 MG capsule Take 40 mg by mouth daily.  0  . vitamin B-12 (CYANOCOBALAMIN) 1000 MCG tablet Take 3,000 mcg by mouth daily.      No current facility-administered medications for this visit.     Review of Systems:  GENERAL:  Fatigue > 1 year.   No fevers, sweats.  Weight loss of 15 pounds in the past year. PERFORMANCE STATUS (ECOG):  1-2. HEENT:  No visual changes, runny nose, sore throat, mouth sores or tenderness. Lungs: No shortness of breath.  Cough (see HPI).  No hemoptysis. Cardiac:  No chest pain, palpitations, orthopnea, or PND. GI:  Diarrhea up to 4x/day after eating.  No nausea, vomiting, constipation, melena or hematochezia.  Ice pica x years. GU:  No urgency, frequency, dysuria, or hematuria. Musculoskeletal:  Back pain.  Osteoporosis with loss of height.  Leg cramps.  No joint pain.  No muscle tenderness. Extremities:  No pain or swelling. Skin:  No rashes or skin other changes. Neuro:  Intermittent confusion.  No headache, numbness or weakness, balance or coordination issues. Endocrine:  Diabetes.  No thyroid issues, hot flashes or night sweats. Psych:  No mood changes, depression or anxiety. Pain:  No focal pain. Review of systems:  All other systems reviewed and found to be negative.  Physical Exam: Blood pressure 116/60, pulse 67, temperature (!) 97.5 F (36.4 C), temperature source Tympanic, height 5' 2"  (1.575 m), weight 158 lb 7 oz (71.9 kg). GENERAL:  Well developed, well nourished, woman sitting comfortably in the exam room in no acute distress. MENTAL STATUS:  Alert and oriented to person, place and time. HEAD:  Short gray hair.  Normocephalic, atraumatic, face symmetric, no Cushingoid features. EYES:  Glasses.  Blue eyes.  Pupils equal round and reactive to light and accomodation.  No conjunctivitis or scleral icterus. ENT:  Oropharynx clear without lesion.  Tongue normal. Mucous membranes moist.  RESPIRATORY:  Clear to auscultation without rales, wheezes or rhonchi. CARDIOVASCULAR:  Regular rate and rhythm without murmur, rub or gallop. ABDOMEN:  Soft, non-tender, with active bowel sounds, and no hepatosplenomegaly.  No masses. SKIN:  Spider angiomas to upper anterior and posterior chest wall.  No rashes,  ulcers or lesions. EXTREMITIES: No edema, no skin discoloration or tenderness.  No palpable cords. LYMPH NODES: No palpable cervical, supraclavicular, axillary or inguinal adenopathy  NEUROLOGICAL: Unremarkable. PSYCH:  Appropriate.   Hospital Outpatient Visit on 03/30/2017  Component Date Value Ref Range Status  . Creatinine, Ser 03/30/2017 0.60  0.44 - 1.00 mg/dL Final    Assessment:  EMMILYN CROOKE is a 67 y.o. 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  She has mild thrombocytopenia and leukopenia since 12/2015.  Platelet count has ranged between 70,000 - 103,000 without trend.  WBC has ranged between 2500 - 3300 without trend.  Diet is modest.  She has ice pica.  She denies any new medications or herbal products.  Outside labs on 03/18/2017 revealed a ferritin 15 with an iron saturation of 13% and a TIBC of 495.5.  B12, folate, and TSH were normal.  Retic was 0.047. Hepatitis A IgM, hepatitis B core IgM, hepatitis C antibody were negative on 01/04/2016.   Hepatitis B surface antigen was negative on 08/19/2016.  Hepatitis C was < 0.1 on 01/29/2017.  Abdomen and pelvic CT on 07/29/2016 revealed a cirrhotic liver with mild splenomegaly (13.2 cm).  There were a few scattered collateral in the abdomen and question esophageal varices.  EGD on 12/10/2016 revealed grade I-II esophageal varices and gastritis.  Colonoscopy on 12/10/2016 revealed a diminutive hyperplastic polyp in the cecum and internal hemorrhoids.  Symptomatically, she has been fatigued x 1 year.  She has lost 15 pounds over the past year.  She has diarrhea after eating.  Exam reveals stigmata of liver disease.  Plan: 1.  Discuss pancytopenia diagnosis. Review differential that includes MDS, sequela of liver disease, thyroid disease. 2.  Labs today: CBC with diff, ANA with reflex, hepatitis B core antibody total, HIV antibody, copper, SPEP, FLCA, AFP, retic. free T4. 3.  Peripheral smear for path review. 4.  Discuss iron  deficiency. Patient does not take oral iron supplements. Patient to start oral iron with vitamin C daily. Discuss the need to consider intravenous iron replacement in the future should she be unable to tolerate iron, or is her iron stores do not improve on oral replacement.  5.  Discuss potential need for bone marrow testing to rule out an underlying MDS if etiology of pancytopenia is unclear.  6.  RTC in 1 week for MD assessment and review of workup.   Addendum:  Notes indicate she may be on hydroxychlorquine (Plaquenil) which is associated with leukopenia and thrombocytopenia.  Will confirm with patient.  Honor Loh, NP  03/30/2017, 3:11 PM   I saw and evaluated the patient, participating in the key portions of the service and reviewing pertinent diagnostic studies and records.  I reviewed the nurse practitioner's note and agree with the findings and the plan.  The assessment and plan were discussed with the patient. Several questions were asked by the patient and answered.   Nolon Stalls, MD 03/30/2017, 3:11 PM

## 2017-03-31 LAB — KAPPA/LAMBDA LIGHT CHAINS
Kappa free light chain: 38.5 mg/L — ABNORMAL HIGH (ref 3.3–19.4)
Kappa, lambda light chain ratio: 1.6 (ref 0.26–1.65)
Lambda free light chains: 24 mg/L (ref 5.7–26.3)

## 2017-03-31 LAB — HIV ANTIBODY (ROUTINE TESTING W REFLEX): HIV SCREEN 4TH GENERATION: NONREACTIVE

## 2017-03-31 LAB — AFP TUMOR MARKER: AFP, Serum, Tumor Marker: 6.2 ng/mL (ref 0.0–8.3)

## 2017-03-31 LAB — ANA W/REFLEX: Anti Nuclear Antibody(ANA): NEGATIVE

## 2017-03-31 LAB — HEPATITIS B CORE ANTIBODY, TOTAL: HEP B C TOTAL AB: NEGATIVE

## 2017-04-01 LAB — PROTEIN ELECTROPHORESIS, SERUM
A/G RATIO SPE: 1.1 (ref 0.7–1.7)
ALPHA-1-GLOBULIN: 0.2 g/dL (ref 0.0–0.4)
ALPHA-2-GLOBULIN: 0.6 g/dL (ref 0.4–1.0)
Albumin ELP: 3.4 g/dL (ref 2.9–4.4)
BETA GLOBULIN: 1.1 g/dL (ref 0.7–1.3)
GLOBULIN, TOTAL: 3.2 g/dL (ref 2.2–3.9)
Gamma Globulin: 1.3 g/dL (ref 0.4–1.8)
Total Protein ELP: 6.6 g/dL (ref 6.0–8.5)

## 2017-04-01 LAB — COPPER, SERUM: Copper: 103 ug/dL (ref 72–166)

## 2017-04-07 ENCOUNTER — Inpatient Hospital Stay (HOSPITAL_BASED_OUTPATIENT_CLINIC_OR_DEPARTMENT_OTHER): Payer: Medicare Other | Admitting: Hematology and Oncology

## 2017-04-07 VITALS — BP 123/69 | HR 87 | Temp 97.3°F | Resp 18 | Wt 158.2 lb

## 2017-04-07 DIAGNOSIS — D61818 Other pancytopenia: Secondary | ICD-10-CM

## 2017-04-07 DIAGNOSIS — Z79899 Other long term (current) drug therapy: Secondary | ICD-10-CM | POA: Diagnosis not present

## 2017-04-07 DIAGNOSIS — R161 Splenomegaly, not elsewhere classified: Secondary | ICD-10-CM

## 2017-04-07 DIAGNOSIS — D696 Thrombocytopenia, unspecified: Secondary | ICD-10-CM

## 2017-04-07 DIAGNOSIS — D72819 Decreased white blood cell count, unspecified: Secondary | ICD-10-CM | POA: Insufficient documentation

## 2017-04-07 DIAGNOSIS — K7460 Unspecified cirrhosis of liver: Secondary | ICD-10-CM

## 2017-04-07 DIAGNOSIS — E611 Iron deficiency: Secondary | ICD-10-CM | POA: Diagnosis not present

## 2017-04-07 NOTE — Progress Notes (Signed)
Patient started ferrous sulfate 325 mg with OJ last week.  C/o back pain 5/10 in mid back.

## 2017-04-07 NOTE — Progress Notes (Signed)
[facility] day:  04/07/2017  Chief Complaint: Deanna Escobar is a 67 y.o. female with pancytopenia who is seen for review of work-up and discussion regarding direction of therapy.  HPI:   The patient was last seen in the hematology clinic on 03/30/2017 for initial consultation.  She has cirrhosis felt secondary to NASH.  She has a history of mild thrombocytopenia and leukopenia since 10/217.  Imaging revealed mild splenomegaly (13.2 cm) with suspected mild sequestration.  Diet was modest.  She had lost 15 pounds in 1 year.  She denied any new medications or herbal products.  She underwent a work-up.  CBC revealed a hematocrit of 38.4, hemoglobin 12.7, MCV 99.8, platelets 84,000, and WBC 3200 with an ANC of 1600.  Differential was unremarkable.  Retic was 0.8%.  Free T4 was 0.77.  ANA was negative.  Hepatitis B core antibody total was negative.  HIV testing was negative.  Copper was 103 (normal).  SPEP revealed no monoclonal protein.  Kappa free light chains were 38.5, lambda free light chains 24, and ratio 1.60 (normal).  AFP was 6.2.  Peripheral smear was unremarkable.  At last visit, she was encouraged to take oral iron secondary to low iron stores (ferritin 15).  Symptomatically, patient is feeling "good". Patient continues to experience chronic back pain and fatigue. Pain is rated 5/10 in the clinic today. She denies any acute concerns today. GI notes made mention that patient is taking Plaquenil. Patient unsure who prescribed this medication, why she was taking it, or if she is still currently taking this medication.   Patient denies any bruising or bleeding. She has started no new medications or herbal products. Patient is eating well. Her weight remains stable.    Past Medical History:  Diagnosis Date  . Cancer ([place])    skin ca  . Cirrhosis, non-alcoholic ([place])   . Diabetes mellitus without complication ([place])    Pt takes Metformin.  Marland Kitchen GERD  (gastroesophageal reflux disease)   . Hypertension     Past Surgical History:  Procedure Laterality Date  . BREAST BIOPSY Right 11/11/12   bx/clip-neg  . COLONOSCOPY WITH PROPOFOL N/A 12/10/2016   Procedure: COLONOSCOPY WITH PROPOFOL;  Surgeon: Manya Silvas, MD;  Location[facility] ENDOSCOPY;  Service: Endoscopy;  Laterality: N/A;  . ESOPHAGOGASTRODUODENOSCOPY (EGD) WITH PROPOFOL N/A 12/10/2016   Procedure: ESOPHAGOGASTRODUODENOSCOPY (EGD) WITH PROPOFOL;  Surgeon: Manya Silvas, MD;  Location: [facility] ENDOSCOPY;  Service: Endoscopy;  Laterality: N/A;  . LIVER BIOPSY  02/2017  . TUBAL LIGATION      Family History  Problem Relation Age of Onset  . Breast cancer Neg Hx     Social History:  reports that  has never smoked. she has never used smokeless tobacco. She reports that she does not drink alcohol or use drugs.  Patient does not smoke or drink alcohol. Patient denies known exposures to radiation on toxins. 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 The patient is accompanied by daughter Deanna Escobar) and her husband Rush Landmark) today.  Allergies: No Known Allergies  Current Medications: Current Outpatient Medications  Medication Sig Dispense Refill  . alendronate (FOSAMAX) 70 MG tablet Take 70 mg by mouth once a week. Take with a full glass of water on an empty stomach.    . candesartan (ATACAND) 16 MG tablet Take 16 mg by mouth daily.  0  . Cholecalciferol 2000 units TABS Take by mouth.    . citalopram (CELEXA) 20  MG tablet Take 20 mg by mouth daily.    . clobetasol ointment (TEMOVATE) 3.79 % Apply 1 application topically 2 (two) times daily.    . ferrous sulfate 325 (65 FE) MG tablet Take 325 mg by mouth daily with breakfast.    . furosemide (LASIX) 20 MG tablet Take 20 mg by mouth.    Marland Kitchen glimepiride (AMARYL) 2 MG tablet Take 2 mg by mouth daily with breakfast.    . HM OMEGA-3-6-9 FATTY ACIDS CAPS Take by mouth.    Marland Kitchen lisinopril (PRINIVIL,ZESTRIL) 20 MG tablet Take 20 mg by  mouth daily.    . metFORMIN (GLUCOPHAGE) 500 MG tablet Take 1,000 mg by mouth 2 (two) times daily with a meal.     . omeprazole (PRILOSEC) 40 MG capsule Take 40 mg by mouth daily.  0  . vitamin B-12 (CYANOCOBALAMIN) 1000 MCG tablet Take 3,000 mcg by mouth daily.     . hydroxychloroquine (PLAQUENIL) 200 MG tablet Take by mouth daily.     No current facility-administered medications for this visit.     Review of Systems:  GENERAL:  Feels good except for back.  Fatigue.  No fevers, sweats.  Weight stable since the last visit.  PERFORMANCE STATUS (ECOG):  1-2. HEENT:  No visual changes, runny nose, sore throat, mouth sores or tenderness. Lungs: No shortness of breath.  Cough (see HPI).  No hemoptysis. Cardiac:  No chest pain, palpitations, orthopnea, or PND. GI:  Diarrhea up to 4x/day after eating.  Decreased stool output since last visit.  No nausea, vomiting, constipation, melena or hematochezia.  Ice pica x years. GU:  No urgency, frequency, dysuria, or hematuria. Musculoskeletal:  Back pain.  Osteoporosis with loss of height.  Leg cramps.  No joint pain.  No muscle tenderness. Extremities:  No pain or swelling. Skin:  No rashes or skin other changes. Neuro:  Intermittent confusion.  No headache, numbness or weakness, balance or coordination issues. Endocrine:  Diabetes.  No thyroid issues, hot flashes or night sweats. Psych:  No mood changes, depression or anxiety. Pain:  Back pain (5 out of 10). Review of systems:  All other systems reviewed and found to be negative.  Physical Exam: Blood pressure 123/69, pulse 87, temperature (!) 97.3 F (36.3 C), temperature source Tympanic, resp. rate 18, weight 158 lb 3 oz (71.8 kg). GENERAL:  Well developed, well nourished, woman sitting comfortably in the exam room in no acute distress. MENTAL STATUS:  Alert and oriented to person, place and time. HEAD:  Short gray hair.  Normocephalic, atraumatic, face symmetric, no Cushingoid features. EYES:   Glasses.  Blue eyes. No conjunctivitis or scleral icterus. NEUROLOGICAL: Unremarkable. PSYCH:  Appropriate.   No visits with results within 3 Day(s) from this visit.  Latest known visit with results is:  Hospital Outpatient Visit on 03/30/2017  Component Date Value Ref Range Status  . Creatinine, Ser 03/30/2017 0.60  0.44 - 1.00 mg/dL Final    Assessment:  SHARMILA WROBLESKI is a 67 y.o. 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  She has mild thrombocytopenia and leukopenia since 12/2015.  Platelet count has ranged between 70,000 - 103,000 without trend.  WBC has ranged between 2500 - 3300 without trend.  Diet is modest.  She has ice pica.  Notes indicate she may have been on hydroxychlorquine (Plaquenil) which is associated with leukopenia and thrombocytopenia.  She denies any new medications or herbal products.  Outside labs on 03/18/2017 revealed a ferritin 15 with an iron saturation  of 13% and a TIBC of 495.5.  B12, folate, and TSH were normal.  Retic was 0.047. Hepatitis A IgM, hepatitis B core IgM, hepatitis C antibody were negative on 01/04/2016.   Hepatitis B surface antigen was negative on 08/19/2016.  Hepatitis C was < 0.1 on 01/29/2017.  Work-up on 03/30/2017 revealed a hematocrit of 38.4, hemoglobin 12.7, MCV 99.8, platelets 84,000, and WBC 3200 with an ANC of 1600.  Differential was unremarkable.  Retic was 0.8%.  Normal labs included:  free T4 , ANA, hepatitis B core antibody total, HIV testing , copper, SPEP, free light ratio, and AFP (6.2).  Peripheral smear was unremarkable.  Abdomen and pelvic CT on 07/29/2016 revealed a cirrhotic liver with mild splenomegaly (13.2 cm).  There were a few scattered collateral in the abdomen and question esophageal varices.  EGD on 12/10/2016 revealed grade I-II esophageal varices and gastritis.  Colonoscopy on 12/10/2016 revealed a diminutive hyperplastic polyp in the cecum and internal hemorrhoids.  Symptomatically, she continues to be fatigued.  Patient has chronic back pain. Patient denies bruising or bleeding. Her weight remains stable. Exam reveals stigmata of liver disease.  Plan: 1.  Review work-up.  Discuss mild thrombocytopenia and leukopenia.  Etiology may be secondary to underlying liver disease.  Discuss iron deficiency.  Discuss ongoing surveillance unless drop in counts.  2.  Patient to return call regarding whether or not she is taking Plaquenil. 3.  Follow up with [facility] as discussed regarding potential need for liver transplant in the future.  4.  RTC in 3 months for MD assessment and labs (CBC with diff, ferritin).   Honor Loh, NP  04/07/2017, 12:03 PM   I saw and evaluated the patient, participating in the key portions of the service and reviewing pertinent diagnostic studies and records.  I reviewed the nurse practitioner's note and agree with the findings and the plan.  The assessment and plan were discussed with the patient. Several questions were asked by the patient and answered.   Nolon Stalls, MD 04/07/2017, 12:03 PM

## 2017-04-08 ENCOUNTER — Telehealth: Payer: Self-pay | Admitting: *Deleted

## 2017-04-08 ENCOUNTER — Encounter: Payer: Self-pay | Admitting: Hematology and Oncology

## 2017-04-08 NOTE — Telephone Encounter (Signed)
  Thanks  M

## 2017-04-08 NOTE — Telephone Encounter (Signed)
Patient called and states she never took Plaquenil I have removed it from her medicine list

## 2017-04-12 ENCOUNTER — Encounter: Payer: Self-pay | Admitting: Hematology and Oncology

## 2017-07-06 ENCOUNTER — Inpatient Hospital Stay (HOSPITAL_BASED_OUTPATIENT_CLINIC_OR_DEPARTMENT_OTHER): Payer: Medicare Other | Admitting: Hematology and Oncology

## 2017-07-06 ENCOUNTER — Inpatient Hospital Stay: Payer: Medicare Other | Attending: Hematology and Oncology

## 2017-07-06 ENCOUNTER — Encounter: Payer: Self-pay | Admitting: Hematology and Oncology

## 2017-07-06 VITALS — BP 115/60 | HR 61 | Temp 97.6°F | Resp 18 | Ht 62.0 in | Wt 159.0 lb

## 2017-07-06 DIAGNOSIS — K7460 Unspecified cirrhosis of liver: Secondary | ICD-10-CM

## 2017-07-06 DIAGNOSIS — K7581 Nonalcoholic steatohepatitis (NASH): Secondary | ICD-10-CM | POA: Insufficient documentation

## 2017-07-06 DIAGNOSIS — R161 Splenomegaly, not elsewhere classified: Secondary | ICD-10-CM

## 2017-07-06 DIAGNOSIS — D72819 Decreased white blood cell count, unspecified: Secondary | ICD-10-CM

## 2017-07-06 DIAGNOSIS — D696 Thrombocytopenia, unspecified: Secondary | ICD-10-CM

## 2017-07-06 DIAGNOSIS — D61818 Other pancytopenia: Secondary | ICD-10-CM | POA: Diagnosis not present

## 2017-07-06 DIAGNOSIS — M549 Dorsalgia, unspecified: Secondary | ICD-10-CM

## 2017-07-06 LAB — CBC WITH DIFFERENTIAL/PLATELET
Basophils Absolute: 0 10*3/uL (ref 0–0.1)
Basophils Relative: 1 %
Eosinophils Absolute: 0 10*3/uL (ref 0–0.7)
Eosinophils Relative: 1 %
HCT: 35.9 % (ref 35.0–47.0)
Hemoglobin: 12.4 g/dL (ref 12.0–16.0)
Lymphocytes Relative: 38 %
Lymphs Abs: 0.9 10*3/uL — ABNORMAL LOW (ref 1.0–3.6)
MCH: 36.3 pg — ABNORMAL HIGH (ref 26.0–34.0)
MCHC: 34.5 g/dL (ref 32.0–36.0)
MCV: 105.2 fL — ABNORMAL HIGH (ref 80.0–100.0)
Monocytes Absolute: 0.2 10*3/uL (ref 0.2–0.9)
Monocytes Relative: 8 %
Neutro Abs: 1.2 10*3/uL — ABNORMAL LOW (ref 1.4–6.5)
Neutrophils Relative %: 52 %
Platelets: 73 10*3/uL — ABNORMAL LOW (ref 150–440)
RBC: 3.41 MIL/uL — ABNORMAL LOW (ref 3.80–5.20)
RDW: 15 % — ABNORMAL HIGH (ref 11.5–14.5)
WBC: 2.4 10*3/uL — ABNORMAL LOW (ref 3.6–11.0)

## 2017-07-06 LAB — FERRITIN: Ferritin: 30 ng/mL (ref 11–307)

## 2017-07-06 NOTE — Progress Notes (Signed)
[facility] day:  07/06/2017  Chief Complaint: Deanna Escobar is a 67 y.o. female with pancytopenia who is seen for 3 month assessment.  HPI:   The patient was last seen in the hematology clinic on 04/07/2017.  At that time, her mild thrombocytopenia and leukopenia was felt secondary to underlying liver disease.  She had iron deficiency.  We discussed ongoing surveillance unless drop in counts. It was confirmed after clinic that she was not taking Plaquenil.  Symptomatically, patient has back pain. She has a known "hairline fracture" in her back. She is seeing orthopedics. Patient has been having nocturnal diaphoresis over the course of the last month. She states, "I wake up cold and wet".  She denies fevers and areas of palpable adenopathy.   She is "overwhelmed" per her husband. Patient notes that she is often exhausted after work. She notes excessive daytime sleepiness. She feels rested after sleeping at night. She denies frequent headaches. Husband states, "It is like her energy level is zapped when she is at home". Patient describes an episode of being "zoned" at work. Patient was "out of it" to the point where a coworker noticed her change in mental status, which necessitated her coworker taking her home. Patient had difficulty operating her cell phone.  Patient states, "I think it is because of my medication for my blood pressure and blood sugar". Blood sugar was found to be low at 50. Blood pressure "bottomed out". Patient noted that she "felt like a zombie". Metformin dose recently decreased by PCP.    Diarrhea has resolved. Patient is eating well. Her weight has increased by 1 pound. Patient continues on oral iron supplementation. She has continued ice pica. Restless legs and leg cramping has resolved.   Pain is rated 3/10 today.    Past Medical History:  Diagnosis Date  . Cancer ([place])    skin ca  . Cirrhosis, non-alcoholic ([place])   .  Diabetes mellitus without complication ([place])    Pt takes Metformin.  Marland Kitchen GERD (gastroesophageal reflux disease)   . Hypertension     Past Surgical History:  Procedure Laterality Date  . BREAST BIOPSY Right 11/11/12   bx/clip-neg  . COLONOSCOPY WITH PROPOFOL N/A 12/10/2016   Procedure: COLONOSCOPY WITH PROPOFOL;  Surgeon: Manya Silvas, MD;  Location: [facility] ENDOSCOPY;  Service: Endoscopy;  Laterality: N/A;  . ESOPHAGOGASTRODUODENOSCOPY (EGD) WITH PROPOFOL N/A 12/10/2016   Procedure: ESOPHAGOGASTRODUODENOSCOPY (EGD) WITH PROPOFOL;  Surgeon: Manya Silvas, MD;  Location: [facility] ENDOSCOPY;  Service: Endoscopy;  Laterality: N/A;  . LIVER BIOPSY  02/2017  . TUBAL LIGATION      Family History  Problem Relation Age of Onset  . Breast cancer Neg Hx     Social History:  reports that she has never smoked. She has never used smokeless tobacco. She reports that she does not drink alcohol or use drugs.  Patient does not smoke or drink alcohol. Patient denies known exposures to radiation on toxins. 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 The patient is accompanied  husband Data processing manager) today.  Allergies: No Known Allergies  Current Medications: Current Outpatient Medications  Medication Sig Dispense Refill  . alendronate (FOSAMAX) 70 MG tablet Take 70 mg by mouth once a week. Take with a full glass of water on an empty stomach.    . candesartan (ATACAND) 16 MG tablet Take 8 mg by mouth daily.   0  . Cholecalciferol 2000 units TABS Take by  mouth.    . citalopram (CELEXA) 20 MG tablet Take 20 mg by mouth daily.    . furosemide (LASIX) 20 MG tablet Take 20 mg by mouth.    Marland Kitchen glimepiride (AMARYL) 2 MG tablet Take 2 mg by mouth daily with breakfast.    . metFORMIN (GLUCOPHAGE) 500 MG tablet Take 1,000 mg by mouth 2 (two) times daily with a meal.     . omeprazole (PRILOSEC) 40 MG capsule Take 40 mg by mouth daily.  0  . vitamin B-12 (CYANOCOBALAMIN) 1000 MCG tablet Take 3,000 mcg  by mouth daily.     . clobetasol ointment (TEMOVATE) 5.57 % Apply 1 application topically 2 (two) times daily.    . ferrous sulfate 325 (65 FE) MG tablet Take 325 mg by mouth daily with breakfast.    . HM OMEGA-3-6-9 FATTY ACIDS CAPS Take by mouth.    Marland Kitchen lisinopril (PRINIVIL,ZESTRIL) 20 MG tablet Take 20 mg by mouth daily.     No current facility-administered medications for this visit.     Review of Systems:  GENERAL:  Feels good except for back.  Fatigued.  No fevers.  Some sweats. Weight up 1 pound.  PERFORMANCE STATUS (ECOG):  1 HEENT:  No visual changes, runny nose, sore throat, mouth sores or tenderness. Lungs: No shortness of breath or cough.  No hemoptysis. Cardiac:  No chest pain, palpitations, orthopnea, or PND. GI:  Bowels have improved; no diarrhea. No nausea, vomiting, constipation, melena or hematochezia. GU:  No urgency, frequency, dysuria, or hematuria. Musculoskeletal:  Back pain.  Osteoporosis with loss of height. Hairline fracture in spine. Leg cramps; improved. No muscle tenderness. Extremities:  No pain or swelling. Skin:  Spider angiomas to anterior chest wall. No other rashes or skin changes. Neuro:  No headache, numbness or weakness, balance or coordination issues. Endocrine: Diabetes; sugars running low (see HPI). No thyroid issues, hot flashes or night sweats. Psych:  Some days overwhelmed.  No depression or anxiety. "Could sleep in middle of day". Pain:  3/10 - back Review of systems:  All other systems reviewed and found to be negative.   Physical Exam: Blood pressure 115/60, pulse 61, temperature 97.6 F (36.4 C), temperature source Tympanic, resp. rate 18, height 5' 2"  (1.575 m), weight 159 lb (72.1 kg), SpO2 98 %. GENERAL:  Well developed, well nourished, woman sitting comfortably in the exam room in no acute distress. MENTAL STATUS:  Alert and oriented to person, place and time. HEAD:  Short gray hair.  Normocephalic, atraumatic, face symmetric, no  Cushingoid features. EYES:  Glasses.  Blue eyes.  Pupils equal round and reactive to light and accomodation.  No conjunctivitis or scleral icterus. ENT:  Oropharynx clear without lesion.  Tongue normal. Mucous membranes moist.  RESPIRATORY:  Clear to auscultation without rales, wheezes or rhonchi. CARDIOVASCULAR:  Regular rate and rhythm without murmur, rub or gallop. ABDOMEN:  Soft, non-tender, with active bowel sounds, and no hepatosplenomegaly.  No masses. SKIN:  Spider angiomas anterior chest.  No rashes, ulcers or lesions. EXTREMITIES: No edema, no skin discoloration or tenderness.  No palpable cords. LYMPH NODES: No palpable cervical, supraclavicular, axillary or inguinal adenopathy  NEUROLOGICAL: Unremarkable. PSYCH:  Appropriate.    Appointment on 07/06/2017  Component Date Value Ref Range Status  . WBC 07/06/2017 2.4* 3.6 - 11.0 K/uL Final  . RBC 07/06/2017 3.41* 3.80 - 5.20 MIL/uL Final  . Hemoglobin 07/06/2017 12.4  12.0 - 16.0 g/dL Final  . HCT 07/06/2017 35.9  35.0 -  47.0 % Final  . MCV 07/06/2017 105.2* 80.0 - 100.0 fL Final  . MCH 07/06/2017 36.3* 26.0 - 34.0 pg Final  . MCHC 07/06/2017 34.5  32.0 - 36.0 g/dL Final  . RDW 07/06/2017 15.0* 11.5 - 14.5 % Final  . Platelets 07/06/2017 73* 150 - 440 K/uL Final  . Neutrophils Relative % 07/06/2017 52  % Final  . Neutro Abs 07/06/2017 1.2* 1.4 - 6.5 K/uL Final  . Lymphocytes Relative 07/06/2017 38  % Final  . Lymphs Abs 07/06/2017 0.9* 1.0 - 3.6 K/uL Final  . Monocytes Relative 07/06/2017 8  % Final  . Monocytes Absolute 07/06/2017 0.2  0.2 - 0.9 K/uL Final  . Eosinophils Relative 07/06/2017 1  % Final  . Eosinophils Absolute 07/06/2017 0.0  0 - 0.7 K/uL Final  . Basophils Relative 07/06/2017 1  % Final  . Basophils Absolute 07/06/2017 0.0  0 - 0.1 K/uL Final   Performed at [facility], [address] [place], [place] [address]    Assessment:  AYAH COZZOLINO is a 66 y.o. 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   She has mild thrombocytopenia and leukopenia since 12/2015.  Platelet count has ranged between 70,000 - 103,000 without trend.  WBC has ranged between 2500 - 3300 without trend.  Diet is modest.  She has ice pica.  She denies any new medications or herbal products.  Outside labs on 03/18/2017 revealed a ferritin 15 with an iron saturation of 13% and a TIBC of 495.5.  B12, folate, and TSH were normal.  Retic was 0.047. Hepatitis A IgM, hepatitis B core IgM, hepatitis C antibody were negative on 01/04/2016.   Hepatitis B surface antigen was negative on 08/19/2016.  Hepatitis C was < 0.1 on 01/29/2017.  Work-up on 03/30/2017 revealed a hematocrit of 38.4, hemoglobin 12.7, MCV 99.8, platelets 84,000, and WBC 3200 with an ANC of 1600.  Differential was unremarkable.  Retic was 0.8%.  Normal labs included:  free T4 , ANA, hepatitis B core antibody total, HIV testing, copper, SPEP, free light ratio, and AFP (6.2).  Peripheral smear was unremarkable.  Ferritin has been followed:  15 on 03/18/2017 and 30 on 07/06/2017.  She is on oral iron.  Abdomen and pelvic CT on 07/29/2016 revealed a cirrhotic liver with mild splenomegaly (13.2 cm).  There were a few scattered collateral in the abdomen and question esophageal varices.  EGD on 12/10/2016 revealed grade I-II esophageal varices and gastritis.  Colonoscopy on 12/10/2016 revealed a diminutive hyperplastic polyp in the cecum and internal hemorrhoids.  Symptomatically, she continues to be fatigued. Patient has chronic back pain. Known hairline fracture in spine. Patient denies bruising or bleeding. Her weight remains stable. She is having more night sweats over the course of the last month. Exam reveals stigmata of liver disease. Spider angiomas noted to anterior chest wall.  WBC is 2400 (ANC 1200). Platelets are 73,000.  Hematocrit is 35.9 with a hemoglobin of 12.4.  Plan: 1.  Labs today:  CBC with diff, ferritin. 2.  Discuss mild thrombocytopenia and  leukopenia.  Etiology may be secondary to underlying liver disease.  Discuss iron deficiency. She is on oral iron. Discuss potential need for bone marrow testing to further assess. Differentials include MPD (aplastic anemia, leukemia, lymphoma) versus stigmata of liver disease. Will discuss with hepatology, Dr Devin Going [phone]) with upcoming consult.  4.  Follow up with [facility] as discussed regarding potential need for liver transplant in the future. Patient to have [facility] provider  contact us here at the clinic to discuss plan. 5.  RTC in 3 week for MD assessment and labs (CBC with diff).   Honor Loh, NP  07/06/2017, 3:12 PM   I saw and evaluated the patient, participating in the key portions of the service and reviewing pertinent diagnostic studies and records.  I reviewed the nurse practitioner's note and agree with the findings and the plan.  The assessment and plan were discussed with the patient. Several questions were asked by the patient and answered.   Nolon Stalls, MD 07/06/2017, 3:12 PM

## 2017-07-07 ENCOUNTER — Other Ambulatory Visit: Payer: Self-pay | Admitting: *Deleted

## 2017-07-07 DIAGNOSIS — D696 Thrombocytopenia, unspecified: Secondary | ICD-10-CM

## 2017-07-15 ENCOUNTER — Telehealth: Payer: Self-pay | Admitting: *Deleted

## 2017-07-15 NOTE — Telephone Encounter (Signed)
-----   Message from Lequita Asal, MD sent at 07/15/2017  6:38 AM EDT ----- Regarding: RE: Questions - would like to speak to MD  Let's try to get Dr Angela Adam on the phone today.  She spoke to me briefly last week, but said she had to look at some other things and get back to me.  She has not called me back.  M  ----- Message ----- From: Shirlean Kelly, RN Sent: 07/14/2017   3:22 PM To: Lequita Asal, MD Subject: Questions - would like to speak to MD          Patient wants to know if you have talked to Dr. Angela Adam from [facility]? She saw her last Wednesday.  Dr. Angela Adam wants to obtain additional labs on her this week. Patient states a bone marrow was mentioned.  She wants to know what the plan is regarding the bone marrow.  Also wants to know if her liver has worsened.  Patient is at work today until 5 PM - work number is [phone].   After 5 you can reach her at home @ [phone].

## 2017-07-15 NOTE — Telephone Encounter (Signed)
Called patient and LVM that Dr. Mike Gip has not been able to speak with Dr. Demetrio Lapping.  Will try and contact today and will call patient after their conversation.

## 2017-07-27 ENCOUNTER — Other Ambulatory Visit: Payer: Medicare Other

## 2017-07-27 ENCOUNTER — Ambulatory Visit: Payer: Medicare Other | Admitting: Hematology and Oncology

## 2017-08-06 ENCOUNTER — Inpatient Hospital Stay: Payer: Medicare Other | Attending: Hematology and Oncology

## 2017-08-06 ENCOUNTER — Inpatient Hospital Stay (HOSPITAL_BASED_OUTPATIENT_CLINIC_OR_DEPARTMENT_OTHER): Payer: Medicare Other | Admitting: Hematology and Oncology

## 2017-08-06 ENCOUNTER — Encounter: Payer: Self-pay | Admitting: Hematology and Oncology

## 2017-08-06 ENCOUNTER — Other Ambulatory Visit: Payer: Self-pay

## 2017-08-06 ENCOUNTER — Other Ambulatory Visit: Payer: Self-pay | Admitting: Hematology and Oncology

## 2017-08-06 VITALS — BP 108/59 | Temp 97.8°F | Resp 18 | Ht 62.0 in | Wt 159.3 lb

## 2017-08-06 DIAGNOSIS — K7460 Unspecified cirrhosis of liver: Secondary | ICD-10-CM

## 2017-08-06 DIAGNOSIS — D649 Anemia, unspecified: Secondary | ICD-10-CM

## 2017-08-06 DIAGNOSIS — R161 Splenomegaly, not elsewhere classified: Secondary | ICD-10-CM

## 2017-08-06 DIAGNOSIS — M549 Dorsalgia, unspecified: Secondary | ICD-10-CM | POA: Diagnosis not present

## 2017-08-06 DIAGNOSIS — D61818 Other pancytopenia: Secondary | ICD-10-CM | POA: Diagnosis present

## 2017-08-06 DIAGNOSIS — R5383 Other fatigue: Secondary | ICD-10-CM | POA: Diagnosis not present

## 2017-08-06 DIAGNOSIS — K7581 Nonalcoholic steatohepatitis (NASH): Secondary | ICD-10-CM | POA: Diagnosis not present

## 2017-08-06 DIAGNOSIS — D72819 Decreased white blood cell count, unspecified: Secondary | ICD-10-CM

## 2017-08-06 DIAGNOSIS — G8929 Other chronic pain: Secondary | ICD-10-CM | POA: Diagnosis not present

## 2017-08-06 DIAGNOSIS — D7589 Other specified diseases of blood and blood-forming organs: Secondary | ICD-10-CM

## 2017-08-06 DIAGNOSIS — D696 Thrombocytopenia, unspecified: Secondary | ICD-10-CM

## 2017-08-06 LAB — CBC WITH DIFFERENTIAL/PLATELET
Basophils Absolute: 0 10*3/uL (ref 0–0.1)
Basophils Relative: 1 %
Eosinophils Absolute: 0 10*3/uL (ref 0–0.7)
Eosinophils Relative: 2 %
HCT: 36 % (ref 35.0–47.0)
Hemoglobin: 12.3 g/dL (ref 12.0–16.0)
Lymphocytes Relative: 40 %
Lymphs Abs: 1.1 10*3/uL (ref 1.0–3.6)
MCH: 36.2 pg — ABNORMAL HIGH (ref 26.0–34.0)
MCHC: 34.3 g/dL (ref 32.0–36.0)
MCV: 105.4 fL — ABNORMAL HIGH (ref 80.0–100.0)
Monocytes Absolute: 0.3 10*3/uL (ref 0.2–0.9)
Monocytes Relative: 10 %
Neutro Abs: 1.3 10*3/uL — ABNORMAL LOW (ref 1.4–6.5)
Neutrophils Relative %: 47 %
Platelets: 77 10*3/uL — ABNORMAL LOW (ref 150–440)
RBC: 3.41 MIL/uL — ABNORMAL LOW (ref 3.80–5.20)
RDW: 14.8 % — ABNORMAL HIGH (ref 11.5–14.5)
WBC: 2.7 10*3/uL — ABNORMAL LOW (ref 3.6–11.0)

## 2017-08-06 LAB — COMPREHENSIVE METABOLIC PANEL
ALT: 31 U/L (ref 14–54)
AST: 47 U/L — ABNORMAL HIGH (ref 15–41)
Albumin: 3.4 g/dL — ABNORMAL LOW (ref 3.5–5.0)
Alkaline Phosphatase: 133 U/L — ABNORMAL HIGH (ref 38–126)
Anion gap: 9 (ref 5–15)
BUN: 15 mg/dL (ref 6–20)
CO2: 25 mmol/L (ref 22–32)
Calcium: 9.5 mg/dL (ref 8.9–10.3)
Chloride: 104 mmol/L (ref 101–111)
Creatinine, Ser: 0.63 mg/dL (ref 0.44–1.00)
GFR calc Af Amer: >60 mL/min (ref >60)
GFR calc non Af Amer: >60 mL/min (ref >60)
Glucose, Bld: 70 mg/dL (ref 65–99)
Potassium: 3.7 mmol/L (ref 3.5–5.1)
Sodium: 138 mmol/L (ref 135–145)
Total Bilirubin: 1.6 mg/dL — ABNORMAL HIGH (ref 0.3–1.2)
Total Protein: 6.7 g/dL (ref 6.5–8.1)

## 2017-08-06 LAB — FERRITIN: Ferritin: 53 ng/mL (ref 11–307)

## 2017-08-06 LAB — PROTIME-INR
INR: 1.28
Prothrombin Time: 15.9 seconds — ABNORMAL HIGH (ref 11.4–15.2)

## 2017-08-06 LAB — RETICULOCYTES
RBC.: 3.48 MIL/uL — ABNORMAL LOW (ref 3.80–5.20)
Retic Count, Absolute: 38.3 10*3/uL (ref 19.0–183.0)
Retic Ct Pct: 1.1 % (ref 0.4–3.1)

## 2017-08-06 LAB — FOLATE: Folate: 14.2 ng/mL (ref >5.9)

## 2017-08-06 LAB — TSH: TSH: 0.739 u[IU]/mL (ref 0.350–4.500)
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
## 2017-08-07 LAB — VITAMIN B12: Vitamin B-12: 3502 pg/mL — ABNORMAL HIGH (ref 180–914)

## 2017-08-10 ENCOUNTER — Telehealth: Payer: Self-pay | Admitting: *Deleted

## 2017-08-10 ENCOUNTER — Other Ambulatory Visit: Payer: Self-pay | Admitting: Urgent Care

## 2017-08-10 DIAGNOSIS — D696 Thrombocytopenia, unspecified: Secondary | ICD-10-CM

## 2017-08-10 DIAGNOSIS — D72819 Decreased white blood cell count, unspecified: Secondary | ICD-10-CM

## 2017-08-10 DIAGNOSIS — D7589 Other specified diseases of blood and blood-forming organs: Secondary | ICD-10-CM

## 2017-08-10 NOTE — Telephone Encounter (Signed)
Called patient to inform her that MD plans to move ahead with Bone Marrow Biopsy.  Will fax order to scheduling today.

## 2017-08-10 NOTE — Telephone Encounter (Signed)
Called Patient and made her aware of her CT Biopsy scheduled Date, Time and Location. And also her RTC appt.  2 weeks after date and time. She is aware of all appts.scheduled.

## 2017-08-12 ENCOUNTER — Other Ambulatory Visit: Payer: Self-pay | Admitting: Student

## 2017-08-12 ENCOUNTER — Other Ambulatory Visit: Payer: Self-pay | Admitting: Radiology

## 2017-08-13 ENCOUNTER — Ambulatory Visit
Admission: RE | Admit: 2017-08-13 | Discharge: 2017-08-13 | Disposition: A | Discharge status: Home / Self Care | Payer: Medicare Other | Source: Ambulatory Visit | Attending: Urgent Care | Admitting: Urgent Care

## 2017-08-13 ENCOUNTER — Other Ambulatory Visit (HOSPITAL_COMMUNITY)
Admission: RE | Admit: 2017-08-13 | Discharge: 2017-08-13 | Disposition: A | Discharge status: Home / Self Care | Payer: Medicare Other | Source: Ambulatory Visit | Attending: Hematology and Oncology | Admitting: Hematology and Oncology

## 2017-08-13 DIAGNOSIS — I10 Essential (primary) hypertension: Secondary | ICD-10-CM | POA: Diagnosis not present

## 2017-08-13 DIAGNOSIS — D7589 Other specified diseases of blood and blood-forming organs: Secondary | ICD-10-CM | POA: Insufficient documentation

## 2017-08-13 DIAGNOSIS — D72822 Plasmacytosis: Secondary | ICD-10-CM | POA: Insufficient documentation

## 2017-08-13 DIAGNOSIS — E119 Type 2 diabetes mellitus without complications: Secondary | ICD-10-CM | POA: Diagnosis not present

## 2017-08-13 DIAGNOSIS — K219 Gastro-esophageal reflux disease without esophagitis: Secondary | ICD-10-CM | POA: Diagnosis not present

## 2017-08-13 DIAGNOSIS — Z79899 Other long term (current) drug therapy: Secondary | ICD-10-CM | POA: Diagnosis not present

## 2017-08-13 DIAGNOSIS — Z7984 Long term (current) use of oral hypoglycemic drugs: Secondary | ICD-10-CM | POA: Insufficient documentation

## 2017-08-13 DIAGNOSIS — D61818 Other pancytopenia: Secondary | ICD-10-CM | POA: Diagnosis not present

## 2017-08-13 DIAGNOSIS — D72819 Decreased white blood cell count, unspecified: Secondary | ICD-10-CM | POA: Diagnosis present

## 2017-08-13 DIAGNOSIS — D696 Thrombocytopenia, unspecified: Secondary | ICD-10-CM | POA: Diagnosis not present

## 2017-08-13 DIAGNOSIS — K7460 Unspecified cirrhosis of liver: Secondary | ICD-10-CM | POA: Insufficient documentation

## 2017-08-13 LAB — CBC
HCT: 40.2 % (ref 35.0–47.0)
HEMOGLOBIN: 13.7 g/dL (ref 12.0–16.0)
MCH: 36 pg — ABNORMAL HIGH (ref 26.0–34.0)
MCHC: 34.1 g/dL (ref 32.0–36.0)
MCV: 105.4 fL — AB (ref 80.0–100.0)
Platelets: 83 10*3/uL — ABNORMAL LOW (ref 150–440)
RBC: 3.81 MIL/uL (ref 3.80–5.20)
RDW: 14.3 % (ref 11.5–14.5)
WBC: 2.4 10*3/uL — AB (ref 3.6–11.0)

## 2017-08-13 LAB — GLUCOSE, CAPILLARY: GLUCOSE-CAPILLARY: 96 mg/dL (ref 65–99)

## 2017-08-13 MED ORDER — FENTANYL CITRATE (PF) 100 MCG/2ML IJ SOLN
INTRAMUSCULAR | Status: AC
  Filled 2017-08-13: qty 4

## 2017-08-13 MED ORDER — FENTANYL CITRATE (PF) 100 MCG/2ML IJ SOLN
INTRAMUSCULAR | Status: AC | PRN
  Administered 2017-08-13 (×2): 50 ug via INTRAVENOUS

## 2017-08-13 MED ORDER — LIDOCAINE HCL (PF) 1 % IJ SOLN
INTRAMUSCULAR | Status: AC | PRN
  Administered 2017-08-13: 7 mL

## 2017-08-13 MED ORDER — MIDAZOLAM HCL 2 MG/2ML IJ SOLN
INTRAMUSCULAR | Status: AC | PRN
  Administered 2017-08-13 (×2): 1 mg via INTRAVENOUS

## 2017-08-13 MED ORDER — SODIUM CHLORIDE 0.9 % IV SOLN
INTRAVENOUS | Status: DC
  Administered 2017-08-13 (×2): via INTRAVENOUS

## 2017-08-13 MED ORDER — HEPARIN SOD (PORK) LOCK FLUSH 100 UNIT/ML IV SOLN
INTRAVENOUS | Status: AC
  Filled 2017-08-13: qty 5

## 2017-08-13 MED ORDER — MIDAZOLAM HCL 5 MG/5ML IJ SOLN
INTRAMUSCULAR | Status: AC
  Filled 2017-08-13: qty 5

## 2017-08-13 NOTE — H&P (Signed)
Chief Complaint:   pancytopenia  Referring Physician(s): Hande,Vishwanath  History of Present Illness: Deanna Escobar is a 67 y.o. female with pancytopenia, unexplained. She remains asymtomatic. No complaints today. Here for CT BM asp and core bx  Past Medical History:  Diagnosis Date  . Cancer ([place])    skin ca  . Cirrhosis, non-alcoholic ([place])   . Diabetes mellitus without complication ([place])    Pt takes Metformin.  Marland Kitchen GERD (gastroesophageal reflux disease)   . Hypertension     Past Surgical History:  Procedure Laterality Date  . BREAST BIOPSY Right 11/11/12   bx/clip-neg  . COLONOSCOPY WITH PROPOFOL N/A 12/10/2016   Procedure: COLONOSCOPY WITH PROPOFOL;  Surgeon: Manya Silvas, MD;  Location: [facility] ENDOSCOPY;  Service: Endoscopy;  Laterality: N/A;  . ESOPHAGOGASTRODUODENOSCOPY (EGD) WITH PROPOFOL N/A 12/10/2016   Procedure: ESOPHAGOGASTRODUODENOSCOPY (EGD) WITH PROPOFOL;  Surgeon: Manya Silvas, MD;  Location: [facility] ENDOSCOPY;  Service: Endoscopy;  Laterality: N/A;  . LIVER BIOPSY  02/2017  . TUBAL LIGATION      Allergies: Patient has no known allergies.  Medications: Prior to Admission medications   Medication Sig Start Date End Date Taking? Authorizing Provider  alendronate (FOSAMAX) 70 MG tablet Take 70 mg by mouth once a week. Take with a full glass of water on an empty stomach.   Yes [provider]  candesartan (ATACAND) 16 MG tablet Take 8 mg by mouth daily.  03/09/17  Yes [provider]  Cholecalciferol 2000 units TABS Take by mouth.   Yes [provider]  citalopram (CELEXA) 20 MG tablet Take 20 mg by mouth daily.   Yes [provider]  clobetasol ointment (TEMOVATE) 1.91 % Apply 1 application topically 2 (two) times daily.   Yes [provider]  ferrous sulfate 325 (65 FE) MG tablet Take 325 mg by mouth daily with breakfast.   Yes [provider]  furosemide (LASIX) 20 MG tablet Take 40 mg by  mouth.    Yes [provider]  glimepiride (AMARYL) 2 MG tablet Take 2 mg by mouth daily with breakfast.   Yes [provider]  HM OMEGA-3-6-9 FATTY ACIDS CAPS Take by mouth.   Yes [provider]  lisinopril (PRINIVIL,ZESTRIL) 20 MG tablet Take 20 mg by mouth daily.   Yes [provider]  metFORMIN (GLUCOPHAGE) 500 MG tablet Take 1,000 mg by mouth 2 (two) times daily with a meal.    Yes [provider]  nadolol (CORGARD) 20 MG tablet Take 20 mg by mouth daily.   Yes [provider]  omeprazole (PRILOSEC) 40 MG capsule Take 40 mg by mouth daily. 03/09/17  Yes [provider]  vitamin B-12 (CYANOCOBALAMIN) 1000 MCG tablet Take 3,000 mcg by mouth daily.    Yes [provider]     Family History  Problem Relation Age of Onset  . Breast cancer Neg Hx     Social History   Socioeconomic History  . Marital status: Married    Spouse name: Not on file  . Number of children: Not on file  . Years of education: Not on file  . Highest education level: Not on file  Occupational History  . Not on file  Social Needs  . Financial resource strain: Not on file  . Food insecurity:    Worry: Not on file    Inability: Not on file  . Transportation needs:    Medical: Not on file  Non-medical: Not on file  Tobacco Use  . Smoking status: Never Smoker  . Smokeless tobacco: Never Used  Substance and Sexual Activity  . Alcohol use: No  . Drug use: No  . Sexual activity: Not on file  Lifestyle  . Physical activity:    Days per week: Not on file    Minutes per session: Not on file  . Stress: Not on file  Relationships  . Social connections:    Talks on phone: Not on file    Gets together: Not on file    Attends religious service: Not on file    Active member of club or organization: Not on file    Attends meetings of clubs or organizations: Not on file    Relationship status: Not on file  Other Topics Concern  . Not on  file  Social History Narrative  . Not on file    ECOG Status: 0 - Asymptomatic  Review of Systems: A 12 point ROS discussed and pertinent positives are indicated in the HPI above.  All other systems are negative.  Review of Systems  Vital Signs: BP (!) 114/52   Pulse 68   Temp 98.3 F (36.8 C) (Oral)   Resp 20   Ht 5' 2"  (1.575 m)   Wt 159 lb (72.1 kg)   SpO2 98%   BMI 29.08 kg/m   Physical Exam  Constitutional: She appears well-developed and well-nourished. No distress.  Eyes: Conjunctivae are normal. No scleral icterus.  Cardiovascular: Normal rate, regular rhythm and normal heart sounds.  Pulmonary/Chest: Effort normal and breath sounds normal.  Abdominal: Soft. Bowel sounds are normal.  Skin: She is not diaphoretic.    Mallampati Score:   2  Imaging: No results found.  Labs:  CBC: Recent Labs    03/30/17 1550 07/06/17 1434 08/06/17 1535 08/13/17 0814  WBC 3.2* 2.4* 2.7* 2.4*  HGB 12.7 12.4 12.3 13.7  HCT 38.4 35.9 36.0 40.2  PLT 84* 73* 77* 83*    COAGS: Recent Labs    02/25/17 0807 08/06/17 1545  INR 1.23 1.28  APTT 35  --     BMP: Recent Labs    03/30/17 1005 08/06/17 1545  NA  --  138  K  --  3.7  CL  --  104  CO2  --  25  GLUCOSE  --  70  BUN  --  15  CALCIUM  --  9.5  CREATININE 0.60 0.63  GFRNONAA  --  >60  GFRAA  --  >60    LIVER FUNCTION TESTS: Recent Labs    08/06/17 1545  BILITOT 1.6*  AST 47*  ALT 31  ALKPHOS 133*  PROT 6.7  ALBUMIN 3.4*    TUMOR MARKERS: No results for input(s): AFPTM, CEA, CA199, CHROMGRNA in the last 8760 hours.  Assessment and Plan:  Unexplained pancytopenia, plan for BM asp and core bx today.  Risks and benefits discussed with the patient including, but not limited to bleeding, infection, damage to adjacent structures or low yield requiring additional tests.  All of the patient's questions were answered, patient is agreeable to proceed. Consent signed and in chart.    Thank  you for this interesting consult.  I greatly enjoyed meeting CONCHA SUDOL and look forward to participating in their care.  A copy of this report was sent to the requesting provider on this date.  Electronically Signed: Greggory Keen 08/13/2017, 8:53 AM   I spent a total of  30  Minutes   in face to face in clinical consultation, greater than 50% of which was counseling/coordinating care for this pt with pancytopenia

## 2017-08-13 NOTE — Procedures (Signed)
Pancytopenia  S/P CT BM ASP AND CORE  No comp Stable EBL MIN Path pending Full report in pacs

## 2017-08-13 NOTE — Progress Notes (Signed)
Patient remains clinically stable at this time. As getting up from bedrest to get dressed to go home, noting oozing from bandade. Held pressure for 5 min's after which no further bleeding at puncture site. Dressing reapplied. Vitals remain stable at this time. No visible hematoma noted at site.consulted with Dr Annamaria Boots who has given permission for pt to be discharged.

## 2017-08-13 NOTE — Discharge Instructions (Signed)
Bone Marrow Aspiration and Bone Marrow Biopsy, Adult, Care After °This sheet gives you information about how to care for yourself after your procedure. Your health care provider may also give you more specific instructions. If you have problems or questions, contact your health care provider. °What can I expect after the procedure? °After the procedure, it is common to have: °· Mild pain and tenderness. °· Swelling. °· Bruising. ° °Follow these instructions at home: °· Take over-the-counter or prescription medicines only as told by your health care provider. °· Do not take baths, swim, or use a hot tub until your health care provider approves. Ask if you can take a shower or have a sponge bath. °· Follow instructions from your health care provider about how to take care of the puncture site. Make sure you: °? Wash your hands with soap and water before you change your bandage (dressing). If soap and water are not available, use hand sanitizer. °? Change your dressing as told by your health care provider. °· Check your puncture site every day for signs of infection. Check for: °? More redness, swelling, or pain. °? More fluid or blood. °? Warmth. °? Pus or a bad smell. °· Return to your normal activities as told by your health care provider. Ask your health care provider what activities are safe for you. °· Do not drive for 24 hours if you were given a medicine to help you relax (sedative). °· Keep all follow-up visits as told by your health care provider. This is important. °Contact a health care provider if: °· You have more redness, swelling, or pain around the puncture site. °· You have more fluid or blood coming from the puncture site. °· Your puncture site feels warm to the touch. °· You have pus or a bad smell coming from the puncture site. °· You have a fever. °· Your pain is not controlled with medicine. °This information is not intended to replace advice given to you by your health care provider. Make sure  you discuss any questions you have with your health care provider. °Document Released: 09/13/2004 Document Revised: 09/14/2015 Document Reviewed: 08/08/2015 °Elsevier Interactive Patient Education © 2018 Elsevier Inc. ° °Moderate Conscious Sedation, Adult, Care After °These instructions provide you with information about caring for yourself after your procedure. Your health care provider may also give you more specific instructions. Your treatment has been planned according to current medical practices, but problems sometimes occur. Call your health care provider if you have any problems or questions after your procedure. °What can I expect after the procedure? °After your procedure, it is common: °· To feel sleepy for several hours. °· To feel clumsy and have poor balance for several hours. °· To have poor judgment for several hours. °· To vomit if you eat too soon. ° °Follow these instructions at home: °For at least 24 hours after the procedure: ° °· Do not: °? Participate in activities where you could fall or become injured. °? Drive. °? Use heavy machinery. °? Drink alcohol. °? Take sleeping pills or medicines that cause drowsiness. °? Make important decisions or sign legal documents. °? Take care of children on your own. °· Rest. °Eating and drinking °· Follow the diet recommended by your health care provider. °· If you vomit: °? Drink water, juice, or soup when you can drink without vomiting. °? Make sure you have little or no nausea before eating solid foods. °General instructions °· Have a responsible adult stay with you until you   are awake and alert.  Take over-the-counter and prescription medicines only as told by your health care provider.  If you smoke, do not smoke without supervision.  Keep all follow-up visits as told by your health care provider. This is important. Contact a health care provider if:  You keep feeling nauseous or you keep vomiting.  You feel light-headed.  You develop a  rash.  You have a fever. Get help right away if:  You have trouble breathing. This information is not intended to replace advice given to you by your health care provider. Make sure you discuss any questions you have with your health care provider. Document Released: 12/15/2012 Document Revised: 07/30/2015 Document Reviewed: 06/16/2015 Elsevier Interactive Patient Education  [employer].

## 2017-08-13 NOTE — Progress Notes (Signed)
Patient remains clinically stable post bone marrow biopsy per Dr Annamaria Boots. bandade dressing dry/intact. Sinus rhythm per monitor. Vitals stable. Taking po's without difficulty. Denies complaints. Discharge instructions given earlier with questions answered.

## 2017-08-25 ENCOUNTER — Encounter (HOSPITAL_COMMUNITY): Payer: Self-pay | Admitting: Hematology and Oncology

## 2017-08-27 ENCOUNTER — Inpatient Hospital Stay: Payer: Medicare Other | Attending: Hematology and Oncology | Admitting: Hematology and Oncology

## 2017-08-27 ENCOUNTER — Encounter: Payer: Self-pay | Admitting: Hematology and Oncology

## 2017-08-27 VITALS — BP 104/65 | HR 64 | Temp 97.4°F | Resp 18 | Wt 160.1 lb

## 2017-08-27 DIAGNOSIS — G8929 Other chronic pain: Secondary | ICD-10-CM | POA: Diagnosis not present

## 2017-08-27 DIAGNOSIS — D72819 Decreased white blood cell count, unspecified: Secondary | ICD-10-CM

## 2017-08-27 DIAGNOSIS — K7460 Unspecified cirrhosis of liver: Secondary | ICD-10-CM | POA: Diagnosis not present

## 2017-08-27 DIAGNOSIS — R5383 Other fatigue: Secondary | ICD-10-CM | POA: Insufficient documentation

## 2017-08-27 DIAGNOSIS — K7581 Nonalcoholic steatohepatitis (NASH): Secondary | ICD-10-CM | POA: Insufficient documentation

## 2017-08-27 DIAGNOSIS — R161 Splenomegaly, not elsewhere classified: Secondary | ICD-10-CM

## 2017-08-27 DIAGNOSIS — M549 Dorsalgia, unspecified: Secondary | ICD-10-CM

## 2017-08-27 DIAGNOSIS — D61818 Other pancytopenia: Secondary | ICD-10-CM

## 2017-08-27 DIAGNOSIS — D7589 Other specified diseases of blood and blood-forming organs: Secondary | ICD-10-CM

## 2017-08-27 DIAGNOSIS — D696 Thrombocytopenia, unspecified: Secondary | ICD-10-CM

## 2017-08-27 NOTE — Progress Notes (Signed)
[facility] day:  08/27/2017  Chief Complaint: Deanna Escobar is a 67 y.o. female with pancytopenia who is seen for review of interval bone marrow and discussion regarding direction of therapy.  HPI:   The patient was last seen in the hematology clinic on 08/06/2017.  At that time, she remained fatigued. Patient had chronic back pain. She had a known hairline fracture in spine. She noted bruising.  Her weight was stable. Exam revealed stigmata of liver disease with spider angiomas on her anterior chest wall.  WBC was 2700 (ANC 1300). Platelets were 77,000.  Hematocrit was 36.0 with a hemoglobin of 12.3.  Bone marrow on 08/13/2017 revealed a hypercellular bone marrow for age with trilineage hematopoesis. There were a few small lymphoid aggregates. Mild plasmacytosis (4% plasma cells). CD138 immunohistochemistry performed on the core biopsy shows a slightly increased plasma cell population. The  plasma cells are polytypic by kappa and lambda in-situ hybridization.  Flow cytometry revealed no monoclonal B cells or abnormal T cell phenotype.  Cytogenetics were normal (48, XX).  During the interim, patient is doing well. She tolerated her bone marrow biopsy well. She notes that she experienced no post-procedure pain. Patient has no acute complaints today. She has continued back pain secondary to a known fracture in her spine.   Patient is scheduled to follow up with GI provider in July to discuss plans for treatment going forward.   Patient denies that she has experienced any B symptoms. She denies any interval infections. Patient maintains an adequate appetite, and notes that she is eating well. Weight, compared to her last visit to the clinic, has increased by 1 pounds.   Patient complains of pain rated 3/10 in the clinic today.   Past Medical History:  Diagnosis Date  . Cancer ([place])    skin ca  . Cirrhosis, non-alcoholic ([place])   . Diabetes mellitus  without complication ([place])    Pt takes Metformin.  Marland Kitchen GERD (gastroesophageal reflux disease)   . Hypertension     Past Surgical History:  Procedure Laterality Date  . BREAST BIOPSY Right 11/11/12   bx/clip-neg  . COLONOSCOPY WITH PROPOFOL N/A 12/10/2016   Procedure: COLONOSCOPY WITH PROPOFOL;  Surgeon: Manya Silvas, MD;  Location: [facility] ENDOSCOPY;  Service: Endoscopy;  Laterality: N/A;  . ESOPHAGOGASTRODUODENOSCOPY (EGD) WITH PROPOFOL N/A 12/10/2016   Procedure: ESOPHAGOGASTRODUODENOSCOPY (EGD) WITH PROPOFOL;  Surgeon: Manya Silvas, MD;  Location: [facility] ENDOSCOPY;  Service: Endoscopy;  Laterality: N/A;  . LIVER BIOPSY  02/2017  . TUBAL LIGATION      Family History  Problem Relation Age of Onset  . Breast cancer Neg Hx     Social History:  reports that she has never smoked. She has never used smokeless tobacco. She reports that she does not drink alcohol or use drugs.  Patient does not smoke or drink alcohol. Patient denies known exposures to radiation on toxins. 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 The patient is accompanied by her husband Rush Landmark) and daughter today.  Allergies: No Known Allergies  Current Medications: Current Outpatient Medications  Medication Sig Dispense Refill  . alendronate (FOSAMAX) 70 MG tablet Take 70 mg by mouth once a week. Take with a full glass of water on an empty stomach.    . candesartan (ATACAND) 16 MG tablet Take 8 mg by mouth daily.   0  . Cholecalciferol 2000 units TABS Take by mouth.    . citalopram (CELEXA)  20 MG tablet Take 20 mg by mouth daily.    . clobetasol ointment (TEMOVATE) 9.44 % Apply 1 application topically 2 (two) times daily.    . ferrous sulfate 325 (65 FE) MG tablet Take 325 mg by mouth daily with breakfast.    . furosemide (LASIX) 20 MG tablet Take 40 mg by mouth.     Marland Kitchen glimepiride (AMARYL) 2 MG tablet Take 2 mg by mouth daily with breakfast.    . HM OMEGA-3-6-9 FATTY ACIDS CAPS Take by mouth.     . lactulose (CHRONULAC) 10 GM/15ML solution   0  . lisinopril (PRINIVIL,ZESTRIL) 20 MG tablet Take 20 mg by mouth daily.    . metFORMIN (GLUCOPHAGE) 500 MG tablet Take 1,000 mg by mouth 2 (two) times daily with a meal.     . nadolol (CORGARD) 20 MG tablet Take 20 mg by mouth daily.    Marland Kitchen omeprazole (PRILOSEC) 40 MG capsule Take 40 mg by mouth daily.  0  . vitamin B-12 (CYANOCOBALAMIN) 1000 MCG tablet Take 3,000 mcg by mouth daily.      No current facility-administered medications for this visit.     Review of Systems  Constitutional: Positive for malaise/fatigue. Negative for diaphoresis, fever and weight loss (weight up 1 pound).  HENT: Negative.  Negative for ear pain, nosebleeds, sinus pain and sore throat.   Eyes: Negative.  Negative for blurred vision, double vision, pain and redness.  Respiratory: Negative for cough, hemoptysis, sputum production and shortness of breath.   Cardiovascular: Negative for chest pain, palpitations, orthopnea, leg swelling and PND.  Gastrointestinal: Negative for abdominal pain, blood in stool, constipation, diarrhea, melena, nausea and vomiting.  Genitourinary: Negative for dysuria, frequency, hematuria and urgency.  Musculoskeletal: Positive for back pain (chronic - known hairline spinal fracture). Negative for falls, joint pain and myalgias.  Skin: Negative for itching and rash.       Spider angiomas to anterior chest wall.  Neurological: Negative for dizziness, tremors, weakness and headaches.  Endo/Heme/Allergies: Does not bruise/bleed easily.       T2DM  Psychiatric/Behavioral: Negative for depression, memory loss and suicidal ideas. The patient is not nervous/anxious and does not have insomnia.   All other systems reviewed and are negative.  Performance status (ECOG): 1 - Symptomatic but completely ambulatory   Physical Exam: Blood pressure 104/65, pulse 64, temperature (!) 97.4 F (36.3 C), temperature source Tympanic, resp. rate 18, weight  160 lb 1 oz (72.6 kg). GENERAL:  Well developed, well nourished, woman sitting comfortably in the exam room in no acute distress. MENTAL STATUS:  Alert and oriented to person, place and time. HEAD:  Short gray hair.  Normocephalic, atraumatic, face symmetric, no Cushingoid features. EYES:  Glasses.  Blue eyes.  No conjunctivitis or scleral icterus. SKIN:  Bone marrow site (right posterior iliac crest) unremarkable.  No rashes, ulcers or lesions. NEUROLOGICAL: Unremarkable. PSYCH:  Appropriate.    No visits with results within 3 Day(s) from this visit.  Latest known visit with results is:  Hospital Outpatient Visit on 08/13/2017  Component Date Value Ref Range Status  . WBC 08/13/2017 2.4* 3.6 - 11.0 K/uL Final  . RBC 08/13/2017 3.81  3.80 - 5.20 MIL/uL Final  . Hemoglobin 08/13/2017 13.7  12.0 - 16.0 g/dL Final  . HCT 08/13/2017 40.2  35.0 - 47.0 % Final  . MCV 08/13/2017 105.4* 80.0 - 100.0 fL Final  . MCH 08/13/2017 36.0* 26.0 - 34.0 pg Final  . MCHC 08/13/2017 34.1  32.0 -  36.0 g/dL Final  . RDW 08/13/2017 14.3  11.5 - 14.5 % Final  . Platelets 08/13/2017 83* 150 - 440 K/uL Final   Performed at [facility], [address] [place], [place] [address]  . Glucose-Capillary 08/13/2017 96  65 - 99 mg/dL Final    Assessment:  SHALLEN LUEDKE is a 67 y.o. 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  She has mild thrombocytopenia and leukopenia since 12/2015.  Platelet count has ranged between 70,000 - 103,000 without trend.  WBC has ranged between 2500 - 3300 without trend.  Diet is modest.  She has ice pica.  She denies any new medications or herbal products.  Outside labs on 03/18/2017 revealed a ferritin 15 with an iron saturation of 13% and a TIBC of 495.5.  B12, folate, and TSH were normal.  Retic was 0.047. Hepatitis A IgM, hepatitis B core IgM, hepatitis C antibody were negative on 01/04/2016.   Hepatitis B surface antigen was negative on 08/19/2016.  Hepatitis C was < 0.1 on  01/29/2017.  Work-up on 03/30/2017 revealed a hematocrit of 38.4, hemoglobin 12.7, MCV 99.8, platelets 84,000, and WBC 3200 with an ANC of 1600.  Differential was unremarkable.  Retic was 0.8%.  Normal labs included:  free T4 , ANA, hepatitis B core antibody total, HIV testing, copper, SPEP, free light ratio, and AFP (6.2).  Peripheral smear was unremarkable.  Bone marrow on 08/13/2017 revealed a hypercellular bone marrow for age with trilineage hematopoesis. There were a few small lymphoid aggregates. Mild plasmacytosis (4% plasma cells). CD138 immunohistochemistry performed on the core biopsy shows a slightly increased plasma cell population. The  plasma cells are polytypic by kappa and lambda in-situ hybridization.  Flow cytometry revealed no monoclonal B cells or abnormal T cell phenotype.  Cytogenetics were normal (40, XX).  Ferritin has been followed:  15 on 03/18/2017 and 30 on 07/06/2017.  She is on oral iron.  Abdomen and pelvic CT on 07/29/2016 revealed a cirrhotic liver with mild splenomegaly (13.2 cm).  There were a few scattered collateral in the abdomen and question esophageal varices.  EGD on 12/10/2016 revealed grade I-II esophageal varices and gastritis.  Colonoscopy on 12/10/2016 revealed a diminutive hyperplastic polyp in the cecum and internal hemorrhoids.  Symptomatically, she remains fatigued. Patient has chronic back pain.  Exam reveals stigmata of liver disease with spider angiomas on her anterior chest wall.    Plan: 1. Review interval bone marrow- no abnormality.  Etiology of low counts secondary to underlying liver disease. 2. Discuss follow up with GI. Patient already scheduled.  3. Discuss plan for follow-up labs every 6 months. 4. RTC in 1 year for MD assessment and labs (CBC with diff).   Honor Loh, NP  08/27/2017, 3:10 PM   I saw and evaluated the patient, participating in the key portions of the service and reviewing pertinent diagnostic studies and records.   I reviewed the nurse practitioner's note and agree with the findings and the plan.  The assessment and plan were discussed with the patient. Several questions were asked by the patient and answered.   Nolon Stalls, MD 08/27/2017, 3:10 PM

## 2017-08-27 NOTE — Progress Notes (Signed)
Pt in for follow up and biopsy results. Husband is with pt.  Pt is "nervous".

## 2017-09-15 ENCOUNTER — Other Ambulatory Visit: Payer: Self-pay | Admitting: Obstetrics and Gynecology

## 2017-09-15 DIAGNOSIS — Z1231 Encounter for screening mammogram for malignant neoplasm of breast: Secondary | ICD-10-CM

## 2017-10-14 ENCOUNTER — Ambulatory Visit
Admission: RE | Admit: 2017-10-14 | Discharge: 2017-10-14 | Disposition: A | Discharge status: Home / Self Care | Payer: Medicare Other | Source: Ambulatory Visit | Attending: Obstetrics and Gynecology | Admitting: Obstetrics and Gynecology

## 2017-10-14 DIAGNOSIS — Z1231 Encounter for screening mammogram for malignant neoplasm of breast: Secondary | ICD-10-CM | POA: Insufficient documentation

## 2018-08-27 ENCOUNTER — Other Ambulatory Visit: Payer: Medicare Other

## 2018-08-27 ENCOUNTER — Ambulatory Visit: Payer: Medicare Other | Admitting: Hematology and Oncology

## 2018-09-12 IMAGING — CT CT BIOPSY
1 of 2 series · 9 of 14 positions shown, 12 images · non-contrast
Comparison: none

INDICATION: Pancytopenia, concern for bone marrow disorder versus chronic anemia
secondary to liver disease

[Series 2: i-spiral 5.0 b30f · axial · 0.88mm/px · z∈[-130,-70]mm · 9 of 23 slices shown, 12 images]
[im 3/23  soft-tissue]
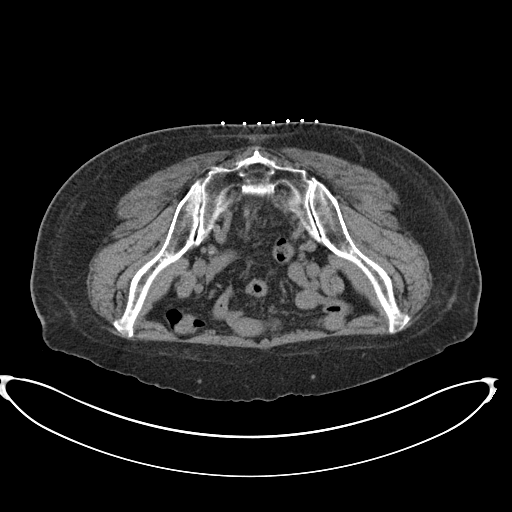
[im 3/23  bone]
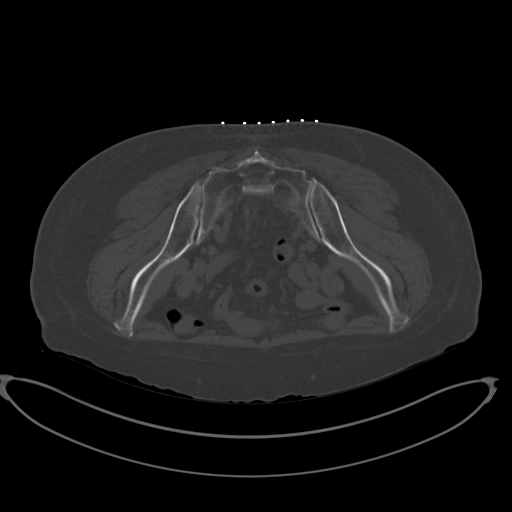
[im 5/23  bone]
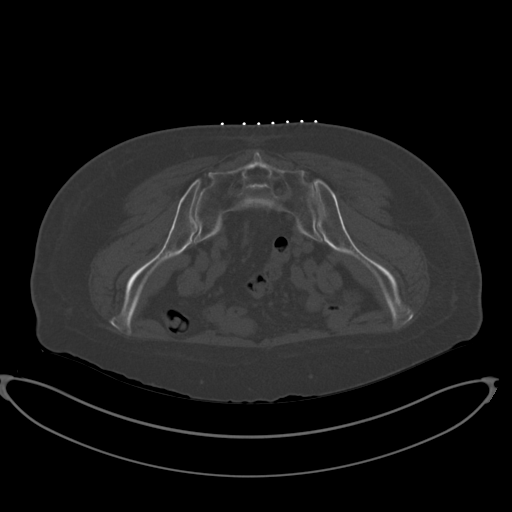
[im 7/23  bone]
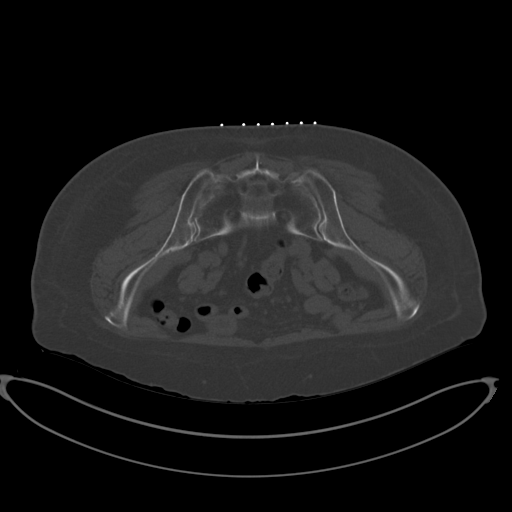
[im 9/23  bone]
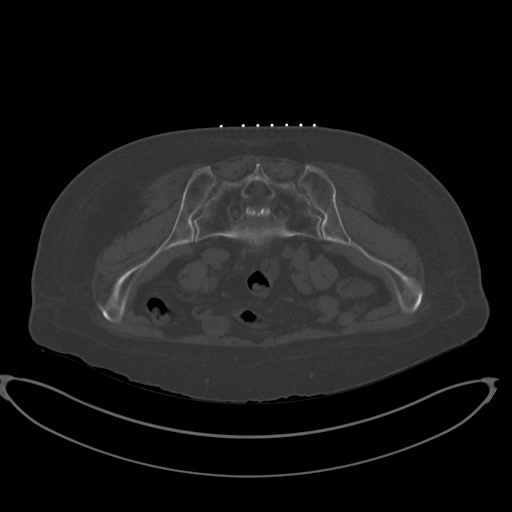
[im 12/23  soft-tissue]
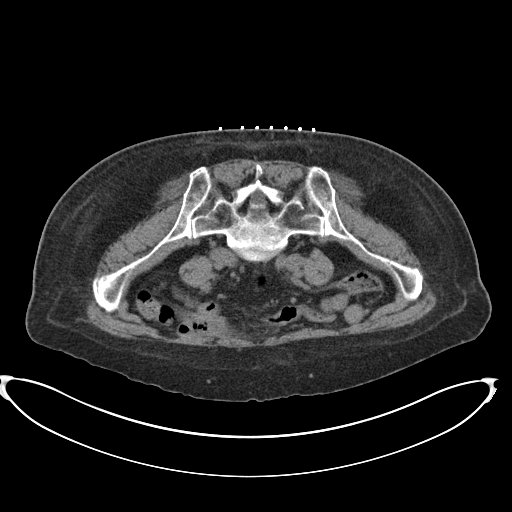
[im 12/23  bone]
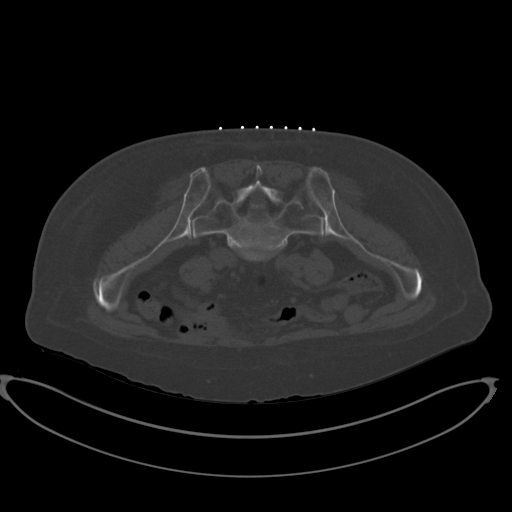
[im 14/23  bone]
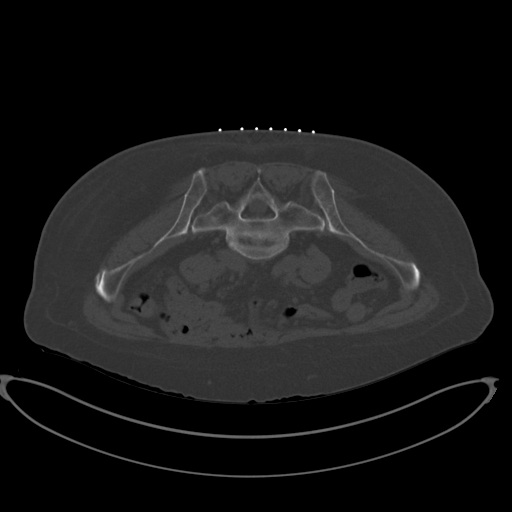
[im 16/23  bone]
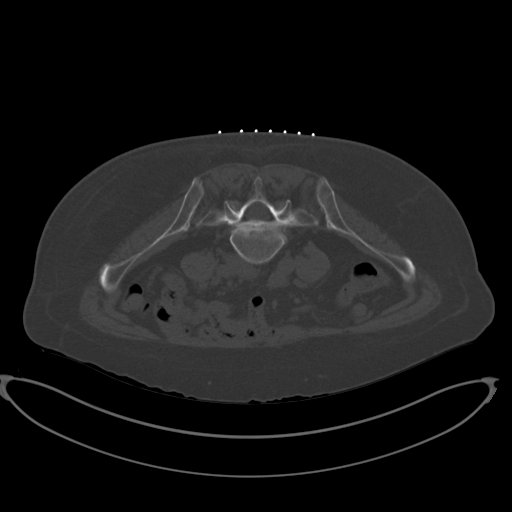
[im 18/23  bone]
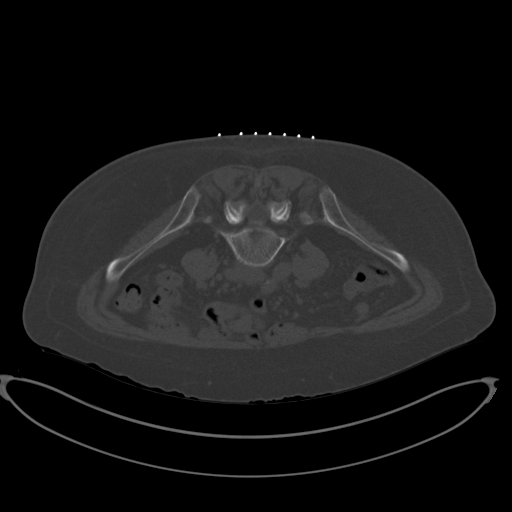
[im 20/23  soft-tissue]
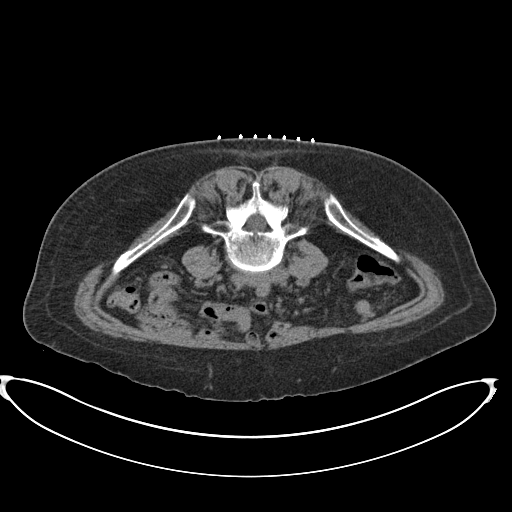
[im 20/23  bone]
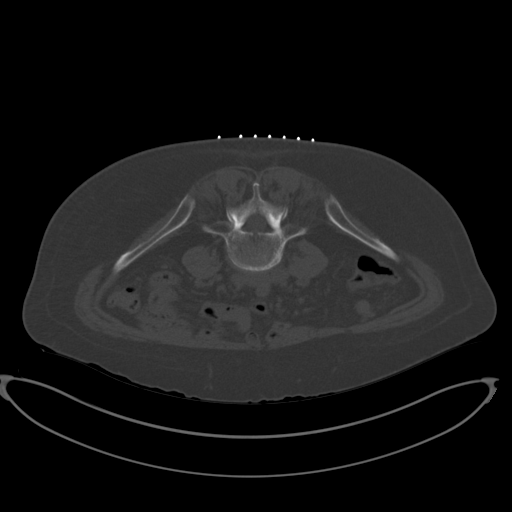

[9 of 14 positions shown; findings below may reference images not displayed]

EXAM:
CT GUIDED RIGHT ILIAC BONE MARROW ASPIRATION AND CORE BIOPSY

Radiologist:  Jim, L[place]

Guidance:  CT

FLUOROSCOPY TIME:  Fluoroscopy Time: NONE.

MEDICATIONS:
1% LIDOCAINE LOCAL

ANESTHESIA/SEDATION:
2.0 mg IV Versed; 100 mcg IV Fentanyl

Moderate Sedation Time:  9 minutes

The patient was continuously monitored during the procedure by the
interventional radiology nurse under my direct supervision.

CONTRAST:  None

COMPLICATIONS:
None.

PROCEDURE:
Informed consent was obtained from the patient following explanation
of the procedure, risks, benefits and alternatives. The patient
understands, agrees and consents for the procedure. All questions
were addressed. A time out was performed.

The patient was positioned prone and non-contrast localization CT
was performed of the pelvis to demonstrate the iliac marrow spaces.

Maximal barrier sterile technique utilized including caps, mask,
sterile gowns, sterile gloves, large sterile drape, hand hygiene,
and Betadine prep.

Under sterile conditions and local anesthesia, an 11 gauge coaxial
bone biopsy needle was advanced into the right iliac marrow space.
Needle position was confirmed with CT imaging. Initially, bone
marrow aspiration was performed. Next, the 11 gauge outer cannula
was utilized to obtain a right iliac bone marrow core biopsy. Needle
was removed. Hemostasis was obtained with compression. The patient
tolerated the procedure well. Samples were prepared with the
cytotechnologist. No immediate complications.
IMPRESSION: CT guided right iliac bone marrow aspiration and core biopsy.

## 2018-09-22 ENCOUNTER — Other Ambulatory Visit: Payer: Self-pay | Admitting: Obstetrics and Gynecology

## 2018-09-22 DIAGNOSIS — Z1231 Encounter for screening mammogram for malignant neoplasm of breast: Secondary | ICD-10-CM

## 2018-10-11 ENCOUNTER — Other Ambulatory Visit: Payer: Self-pay | Admitting: Physician Assistant

## 2018-10-11 ENCOUNTER — Ambulatory Visit
Admission: RE | Admit: 2018-10-11 | Discharge: 2018-10-11 | Disposition: A | Discharge status: Home / Self Care | Payer: Medicare Other | Source: Ambulatory Visit | Attending: Physician Assistant | Admitting: Physician Assistant

## 2018-10-11 ENCOUNTER — Other Ambulatory Visit: Payer: Self-pay

## 2018-10-11 DIAGNOSIS — M7989 Other specified soft tissue disorders: Secondary | ICD-10-CM

## 2018-10-28 ENCOUNTER — Other Ambulatory Visit: Payer: Self-pay

## 2018-10-28 ENCOUNTER — Emergency Department: Payer: Medicare Other

## 2018-10-28 ENCOUNTER — Other Ambulatory Visit: Payer: Self-pay | Admitting: Emergency Medicine

## 2018-10-28 ENCOUNTER — Emergency Department
Admission: EM | Admit: 2018-10-28 | Discharge: 2018-10-28 | Disposition: A | Discharge status: Home / Self Care | Payer: Medicare Other | Attending: Emergency Medicine | Admitting: Emergency Medicine

## 2018-10-28 DIAGNOSIS — E119 Type 2 diabetes mellitus without complications: Secondary | ICD-10-CM | POA: Insufficient documentation

## 2018-10-28 DIAGNOSIS — K7581 Nonalcoholic steatohepatitis (NASH): Secondary | ICD-10-CM | POA: Diagnosis not present

## 2018-10-28 DIAGNOSIS — Z79899 Other long term (current) drug therapy: Secondary | ICD-10-CM | POA: Insufficient documentation

## 2018-10-28 DIAGNOSIS — N200 Calculus of kidney: Secondary | ICD-10-CM | POA: Diagnosis not present

## 2018-10-28 DIAGNOSIS — I10 Essential (primary) hypertension: Secondary | ICD-10-CM | POA: Diagnosis not present

## 2018-10-28 DIAGNOSIS — R1032 Left lower quadrant pain: Secondary | ICD-10-CM | POA: Diagnosis present

## 2018-10-28 DIAGNOSIS — Z7984 Long term (current) use of oral hypoglycemic drugs: Secondary | ICD-10-CM | POA: Diagnosis not present

## 2018-10-28 LAB — URINALYSIS, COMPLETE (UACMP) WITH MICROSCOPIC
Bilirubin Urine: NEGATIVE
Glucose, UA: NEGATIVE mg/dL
Ketones, ur: NEGATIVE mg/dL
Leukocytes,Ua: NEGATIVE
Nitrite: NEGATIVE
Protein, ur: 100 mg/dL — AB
RBC / HPF: >50 RBC/hpf — ABNORMAL HIGH (ref 0–5)
Specific Gravity, Urine: 1.018 (ref 1.005–1.030)
pH: 7 (ref 5.0–8.0)

## 2018-10-28 MED ORDER — OXYCODONE HCL 5 MG PO TABS
5.0000 mg | ORAL_TABLET | Freq: Once | ORAL | Status: AC
  Administered 2018-10-28: 5 mg via ORAL
  Filled 2018-10-28: qty 1

## 2018-10-28 MED ORDER — CEPHALEXIN 500 MG PO CAPS: 500.0000 mg | ORAL_CAPSULE | Freq: Four times a day (QID) | ORAL | 0 refills | Status: AC

## 2018-10-28 MED ORDER — OXYCODONE HCL 5 MG PO TABS: 5.0000 mg | ORAL_TABLET | Freq: Four times a day (QID) | ORAL | 0 refills | Status: DC | PRN

## 2018-10-28 MED ORDER — KETOROLAC TROMETHAMINE 30 MG/ML IJ SOLN
15.0000 mg | Freq: Once | INTRAMUSCULAR | Status: AC
  Administered 2018-10-28: 15 mg via INTRAVENOUS
  Filled 2018-10-28: qty 1

## 2018-10-28 MED ORDER — TAMSULOSIN HCL 0.4 MG PO CAPS
0.4000 mg | ORAL_CAPSULE | Freq: Once | ORAL | Status: AC
  Administered 2018-10-28: 07:00:00 0.4 mg via ORAL
  Filled 2018-10-28: qty 1

## 2018-10-28 MED ORDER — ONDANSETRON 4 MG PO TBDP: 4.0000 mg | ORAL_TABLET | Freq: Three times a day (TID) | ORAL | 0 refills | Status: DC | PRN

## 2018-10-28 MED ORDER — TAMSULOSIN HCL 0.4 MG PO CAPS: 0.4000 mg | ORAL_CAPSULE | Freq: Every day | ORAL | 0 refills | Status: AC

## 2018-10-28 NOTE — Discharge Instructions (Addendum)
You have been seen in the Emergency Department (ED)  Today and was diagnosed with kidney stones. While the stone is traveling through the ureter, which is the tube that carries urine from the kidney to the bladder, you will probably feel pain. The pain may be mild or very severe. You may also have some blood in your urine. As soon as the stone reaches the bladder, any intense pain should go away. If a stone is too large to pass on its own, you may need a medical procedure to help you pass the stone.   As we have discussed, please drink plenty of fluids and use a urinary strainer to attempt to capture the stone.  Please make a follow up appointment with Urology in the next week by calling the number below and bring the stone with you.  Take ibuprofen 440m every 6 hours for the pain. If the pain is not well controlled with ibuprofen you may take one oxycodone every 6 hours. Please also take your prescribed flomax daily.   Follow-up with your doctor or return to the ER in 12-24 hours if your pain is not well controlled, if you develop pain or burning with urination, or if you develop a fever. Otherwise follow up in 3-5 days with your doctor.  When should you call for help?  Call your doctor now or seek immediate medical care if:  You cannot keep down fluids.  Your pain gets worse.  You have a fever or chills.  You have new or worse pain in your back just below your rib cage (the flank area).  You have new or more blood in your urine. You have pain or burning with urination You are unable to urinate You have abdominal pain  Watch closely for changes in your health, and be sure to contact your doctor if:  You do not get better as expected  How can you care for yourself at home?  Drink plenty of fluids, enough so that your urine is light yellow or clear like water. If you have kidney, heart, or liver disease and have to limit fluids, talk with your doctor before you increase the amount of fluids  you drink.  Take pain medicines exactly as directed. Call your doctor if you think you are having a problem with your medicine.  If the doctor gave you a prescription medicine for pain, take it as prescribed.  If you are not taking a prescription pain medicine, ask your doctor if you can take an over-the-counter medicine. Read and follow all instructions on the label. Your doctor may ask you to strain your urine so that you can collect your kidney stone when it passes. You can use a kitchen strainer or a tea strainer to catch the stone. Store it in a plastic bag until you see your doctor again.  Preventing future kidney stones  Some changes in your diet may help prevent kidney stones. Depending on the cause of your stones, your doctor may recommend that you:  Drink plenty of fluids, enough so that your urine is light yellow or clear like water. If you have kidney, heart, or liver disease and have to limit fluids, talk with your doctor before you increase the amount of fluids you drink.  Limit coffee, tea, and alcohol. Also avoid grapefruit juice.  Do not take more than the recommended daily dose of vitamins C and D.  Avoid antacids such as Gaviscon, Maalox, Mylanta, or Tums.  Limit the amount of  salt (sodium) in your diet.  Eat a balanced diet that is not too high in protein.  Limit foods that are high in a substance called oxalate, which can cause kidney stones. These foods include dark green vegetables, rhubarb, chocolate, wheat bran, nuts, cranberries, and beans.

## 2018-10-28 NOTE — ED Notes (Signed)
Assisted patient to restroom for urine sample

## 2018-10-28 NOTE — ED Provider Notes (Signed)
Urine does have 6-10 white cells with few bacteria.  I will send a urine culture and place her on Keflex, otherwise she is cleared for outpatient follow-up.   Earleen Newport, MD 10/28/18 [phone]

## 2018-10-28 NOTE — ED Triage Notes (Signed)
See paper chart for downtime documentation

## 2018-10-28 NOTE — ED Provider Notes (Signed)
[facility] Emergency Department Provider Note  ____________________________________________  Time seen: Approximately 5:38 AM  I have reviewed the triage vital signs and the nursing notes.   HISTORY  Chief Complaint Abdominal Pain   HPI Deanna Escobar is a 68 y.o. female with history of Karlene Lineman cirrhosis, diet-controlled diabetes and hypertension, GERD who presents for evaluation of abdominal pain.  Patient reports that the pain started suddenly at 2 AM and woke her up from her sleep.  The pain is sharp and intermittent, located in the left flank and radiating to the left abdominal region.  She has had nausea but no vomiting, no constipation diarrhea, no dysuria or hematuria, no chest pain or shortness of breath, no cough or fever.  Pain is 10 out of 10.   Past Medical History:  Diagnosis Date  . Cancer ([place])    skin ca  . Cirrhosis, non-alcoholic ([place])   . Diabetes mellitus without complication ([place])    Pt takes Metformin.  Marland Kitchen GERD (gastroesophageal reflux disease)   . Hypertension     Patient Active Problem List   Diagnosis Date Noted  . Macrocytosis 08/06/2017  . Thrombocytopenia ([place]) 04/07/2017  . Leukopenia 04/07/2017  . Cirrhosis, non-alcoholic ([place]) 92/03/69  . Splenomegaly 04/07/2017    Past Surgical History:  Procedure Laterality Date  . BREAST BIOPSY Right 11/11/12   bx/clip-neg  . COLONOSCOPY WITH PROPOFOL N/A 12/10/2016   Procedure: COLONOSCOPY WITH PROPOFOL;  Surgeon: Manya Silvas, MD;  Location: [facility] ENDOSCOPY;  Service: Endoscopy;  Laterality: N/A;  . ESOPHAGOGASTRODUODENOSCOPY (EGD) WITH PROPOFOL N/A 12/10/2016   Procedure: ESOPHAGOGASTRODUODENOSCOPY (EGD) WITH PROPOFOL;  Surgeon: Manya Silvas, MD;  Location: [facility] ENDOSCOPY;  Service: Endoscopy;  Laterality: N/A;  . LIVER BIOPSY  02/2017  . TUBAL LIGATION      Prior to Admission medications   Medication Sig Start Date End Date Taking? Authorizing Provider  alendronate  (FOSAMAX) 70 MG tablet Take 70 mg by mouth once a week. Take with a full glass of water on an empty stomach.    [provider]  candesartan (ATACAND) 16 MG tablet Take 8 mg by mouth daily.  03/09/17   [provider]  Cholecalciferol 2000 units TABS Take by mouth.    [provider]  citalopram (CELEXA) 20 MG tablet Take 20 mg by mouth daily.    [provider]  clobetasol ointment (TEMOVATE) 2.19 % Apply 1 application topically 2 (two) times daily.    [provider]  ferrous sulfate 325 (65 FE) MG tablet Take 325 mg by mouth daily with breakfast.    [provider]  furosemide (LASIX) 20 MG tablet Take 40 mg by mouth.     [provider]  glimepiride (AMARYL) 2 MG tablet Take 2 mg by mouth daily with breakfast.    [provider]  HM OMEGA-3-6-9 FATTY ACIDS CAPS Take by mouth.    [provider]  lactulose (CHRONULAC) 10 GM/15ML solution  08/06/17   [provider]  lisinopril (PRINIVIL,ZESTRIL) 20 MG tablet Take 20 mg by mouth daily.    [provider]  metFORMIN (GLUCOPHAGE) 500 MG tablet Take 1,000 mg by mouth 2 (two) times daily with a meal.     [provider]  nadolol (CORGARD) 20 MG tablet Take 20 mg by mouth daily.    [provider]  omeprazole (PRILOSEC) 40 MG capsule Take 40 mg by mouth daily. 03/09/17   [provider]  ondansetron (ZOFRAN ODT)  4 MG disintegrating tablet Take 1 tablet (4 mg total) by mouth every 8 (eight) hours as needed. 10/28/18   Rudene Re, MD  oxyCODONE (ROXICODONE) 5 MG immediate release tablet Take 1 tablet (5 mg total) by mouth every 6 (six) hours as needed for moderate pain or severe pain. 10/28/18 10/28/19  Rudene Re, MD  tamsulosin (FLOMAX) 0.4 MG CAPS capsule Take 1 capsule (0.4 mg total) by mouth daily for 7 days. 10/28/18 11/04/18  Rudene Re, MD  vitamin B-12 (CYANOCOBALAMIN) 1000 MCG tablet Take 3,000 mcg by  mouth daily.     [provider]    Allergies Patient has no known allergies.  Family History  Problem Relation Age of Onset  . Breast cancer Neg Hx     Social History Social History   Tobacco Use  . Smoking status: Never Smoker  . Smokeless tobacco: Never Used  Substance Use Topics  . Alcohol use: No  . Drug use: No    Review of Systems  Constitutional: Negative for fever. Eyes: Negative for visual changes. ENT: Negative for sore throat. Neck: No neck pain  Cardiovascular: Negative for chest pain. Respiratory: Negative for shortness of breath. Gastrointestinal: + L sided abdominal pain and nausea. No vomiting or diarrhea. Genitourinary: Negative for dysuria. + L flank pain Musculoskeletal: Negative for back pain. Skin: Negative for rash. Neurological: Negative for headaches, weakness or numbness. Psych: No SI or HI  ____________________________________________   PHYSICAL EXAM:  VITAL SIGNS:   Constitutional: Alert and oriented. Well appearing and in no apparent distress. HEENT:      Head: Normocephalic and atraumatic.         Eyes: Conjunctivae are normal. Sclera is non-icteric.       Mouth/Throat: Mucous membranes are moist.       Neck: Supple with no signs of meningismus. Cardiovascular: Regular rate and rhythm. No murmurs, gallops, or rubs. 2+ symmetrical distal pulses are present in all extremities. No JVD. Respiratory: Normal respiratory effort. Lungs are clear to auscultation bilaterally. No wheezes, crackles, or rhonchi.  Gastrointestinal: Soft, tender to palpation in the left quadrants, and non distended with positive bowel sounds. No rebound or guarding. Genitourinary: L CVA tenderness. Musculoskeletal: Nontender with normal range of motion in all extremities. No edema, cyanosis, or erythema of extremities. Neurologic: Normal speech and language. Face is symmetric. Moving all extremities. No gross focal neurologic deficits are appreciated.  Skin: Skin is warm, dry and intact. No rash noted. Psychiatric: Mood and affect are normal. Speech and behavior are normal.  ____________________________________________   LABS (all labs ordered are listed, but only abnormal results are displayed)  Labs Reviewed  CBC WITH DIFFERENTIAL/PLATELET  COMPREHENSIVE METABOLIC PANEL  LIPASE, BLOOD  URINALYSIS, COMPLETE (UACMP) WITH MICROSCOPIC  TROPONIN I (HIGH SENSITIVITY)   ____________________________________________  EKG  ED ECG REPORT I, Rudene Re, the attending physician, personally viewed and interpreted this ECG.  Sinus bradycardia, rate of 52, prolonged QTC, normal axis, no ST elevations or depressions.  No prior for comparison ____________________________________________  RADIOLOGY  I have personally reviewed the images performed during this visit and I agree with the Radiologist's read.   CT renal: 27m L ureteral stone with mild hydronephrosis ____________________________________________   PROCEDURES  Procedure(s) performed: None Procedures Critical Care performed:  None ____________________________________________   INITIAL IMPRESSION / ASSESSMENT AND PLAN / ED COURSE  68y.o. female with history of NKarlene Linemancirrhosis, diet-controlled diabetes and hypertension, GERD who presents for evaluation of sudden onset of left flank and left abdominal  pain.  Differential diagnosis including kidney stone versus pyelonephritis versus SBO versus diverticulitis versus peptic ulcer disease versus gastritis.  Plan for CBC, CMP, lipase, urinalysis, CT abdomen pelvis.  Will give fentanyl and Zofran for symptom relief.    _________________________ 6:46 AM on 10/28/2018 -----------------------------------------  CT consistent with a 2 mm left ureteral stone with mild hydronephrosis.  Pain is well controlled.  Will switch to p.o. meds.  UA is pending and as long as that is negative plan to discharge home with follow-up with  urology.  Discussed standard return precautions and follow-up with patient    As part of my medical decision making, I reviewed the following data within the [facility] notes reviewed and incorporated, Labs reviewed , EKG interpreted , Old chart reviewed, Radiograph reviewed , Discussed with radiologist, Notes from prior ED visits and [place] Controlled Substance Database   Patient was evaluated in Emergency Department today for the symptoms described in the history of present illness. Patient was evaluated in the context of the global COVID-19 pandemic, which necessitated consideration that the patient might be at risk for infection with the SARS-CoV-2 virus that causes COVID-19. Institutional protocols and algorithms that pertain to the evaluation of patients at risk for COVID-19 are in a state of rapid change based on information released by regulatory bodies including the CDC and federal and state organizations. These policies and algorithms were followed during the patient's care in the ED.   ____________________________________________   FINAL CLINICAL IMPRESSION(S) / ED DIAGNOSES   Final diagnoses:  Kidney stone      NEW MEDICATIONS STARTED DURING THIS VISIT:  ED Discharge Orders         Ordered    tamsulosin (FLOMAX) 0.4 MG CAPS capsule  Daily     10/28/18 0650    oxyCODONE (ROXICODONE) 5 MG immediate release tablet  Every 6 hours PRN     10/28/18 0650    ondansetron (ZOFRAN ODT) 4 MG disintegrating tablet  Every 8 hours PRN     10/28/18 0650           Note:  This document was prepared using Dragon voice recognition software and may include unintentional dictation errors.    Rudene Re, MD 10/28/18 [phone]

## 2018-10-29 ENCOUNTER — Other Ambulatory Visit: Payer: Self-pay

## 2018-10-29 ENCOUNTER — Ambulatory Visit
Admission: RE | Admit: 2018-10-29 | Discharge: 2018-10-29 | Disposition: A | Discharge status: Home / Self Care | Payer: Medicare Other | Source: Ambulatory Visit | Attending: Obstetrics and Gynecology | Admitting: Obstetrics and Gynecology

## 2018-10-29 DIAGNOSIS — Z1231 Encounter for screening mammogram for malignant neoplasm of breast: Secondary | ICD-10-CM | POA: Diagnosis not present

## 2018-10-29 LAB — URINE CULTURE
Culture: <10000 — AB
Special Requests: NORMAL

## 2018-11-10 ENCOUNTER — Other Ambulatory Visit: Payer: Self-pay

## 2018-11-10 ENCOUNTER — Encounter: Payer: Self-pay | Admitting: Urology

## 2018-11-10 ENCOUNTER — Ambulatory Visit (INDEPENDENT_AMBULATORY_CARE_PROVIDER_SITE_OTHER): Payer: Medicare Other | Admitting: Urology

## 2018-11-10 VITALS — BP 117/61 | HR 62 | Ht 61.0 in | Wt 161.0 lb

## 2018-11-10 DIAGNOSIS — N201 Calculus of ureter: Secondary | ICD-10-CM | POA: Diagnosis not present

## 2018-11-10 LAB — MICROSCOPIC EXAMINATION
Bacteria, UA: NONE SEEN
RBC, Urine: NONE SEEN /hpf (ref 0–2)
WBC, UA: NONE SEEN /hpf (ref 0–5)

## 2018-11-10 LAB — URINALYSIS, COMPLETE
Bilirubin, UA: NEGATIVE
Glucose, UA: NEGATIVE
Ketones, UA: NEGATIVE
Leukocytes,UA: NEGATIVE
Nitrite, UA: NEGATIVE
Protein,UA: NEGATIVE
RBC, UA: NEGATIVE
Specific Gravity, UA: 1.02 (ref 1.005–1.030)
Urobilinogen, Ur: 0.2 mg/dL (ref 0.2–1.0)
pH, UA: 6 (ref 5.0–7.5)

## 2018-11-10 NOTE — Progress Notes (Signed)
11/10/2018 1:01 PM   Deanna Escobar 07/31/1950 [id number]  Referring provider: Tracie Harrier, MD [address] [facility] [place],  [place] [address]  Chief Complaint  Patient presents with  . Nephrolithiasis    New patient    HPI: 68 year old female who presents today for further evaluation of kidney stones.  Notably, she was seen and evaluated in the emergency room with acute onset severe left flank pain on 10/28/2018.  At the time, she was found to have a 2 mm left proximal ureteral stone with minimal obstructive changes.  Her pain was able to be controlled in the emergency room and after being discharged, she said no further left flank pain.  Urinalysis was somewhat suspicious and she was treated for presumed urinary tract infection as a precaution.  Her urine culture was ultimately negative.  She denies a personal history of kidney stones.  She does have a personal history of chronic back pain.  No urinary symptoms including no dysuria or gross hematuria.  She does drink lots of water due to her history of cirrhosis.  She is concerned today about lower extremity edema which is been present for several weeks prior to her kidney stone episode.   PMH: Past Medical History:  Diagnosis Date  . Cancer ([place])    skin ca  . Cirrhosis, non-alcoholic ([place])   . Diabetes mellitus without complication ([place])    Pt takes Metformin.  Marland Kitchen GERD (gastroesophageal reflux disease)   . Hypertension     Surgical History: Past Surgical History:  Procedure Laterality Date  . BREAST BIOPSY Right 11/11/12   bx/clip-neg  . COLONOSCOPY WITH PROPOFOL N/A 12/10/2016   Procedure: COLONOSCOPY WITH PROPOFOL;  Surgeon: Manya Silvas, MD;  Location: [facility] ENDOSCOPY;  Service: Endoscopy;  Laterality: N/A;  . ESOPHAGOGASTRODUODENOSCOPY (EGD) WITH PROPOFOL N/A 12/10/2016   Procedure: ESOPHAGOGASTRODUODENOSCOPY (EGD) WITH PROPOFOL;  Surgeon: Manya Silvas, MD;  Location: [facility]  ENDOSCOPY;  Service: Endoscopy;  Laterality: N/A;  . LIVER BIOPSY  02/2017  . TUBAL LIGATION      Home Medications:  Allergies as of 11/10/2018   No Known Allergies     Medication List       Accurate as of November 10, 2018  1:01 PM. If you have any questions, ask your nurse or doctor.        STOP taking these medications   metFORMIN 500 MG tablet Commonly known as: GLUCOPHAGE Stopped by: Hollice Espy, MD   ondansetron 4 MG disintegrating tablet Commonly known as: Zofran ODT Stopped by: Hollice Espy, MD   oxyCODONE 5 MG immediate release tablet Commonly known as: Roxicodone Stopped by: Hollice Espy, MD     TAKE these medications   alendronate 70 MG tablet Commonly known as: FOSAMAX Take 70 mg by mouth once a week. Take with a full glass of water on an empty stomach.   candesartan 16 MG tablet Commonly known as: ATACAND Take 8 mg by mouth daily.   Cholecalciferol 50 MCG (2000 UT) Tabs Take by mouth.   citalopram 20 MG tablet Commonly known as: CELEXA Take 20 mg by mouth daily.   clobetasol ointment 0.05 % Commonly known as: TEMOVATE Apply 1 application topically 2 (two) times daily.   ferrous sulfate 325 (65 FE) MG tablet Take 325 mg by mouth daily with breakfast.   furosemide 20 MG tablet Commonly known as: LASIX Take 40 mg by mouth.   glimepiride 2 MG tablet Commonly known as: AMARYL Take 2 mg  by mouth daily with breakfast.   HM Omega-3-6-9 Fatty Acids Caps Take by mouth.   lactulose 10 GM/15ML solution Commonly known as: CHRONULAC   lisinopril 20 MG tablet Commonly known as: ZESTRIL Take 20 mg by mouth daily.   nadolol 20 MG tablet Commonly known as: CORGARD Take 20 mg by mouth daily.   omeprazole 40 MG capsule Commonly known as: PRILOSEC Take 40 mg by mouth daily.   vitamin B-12 1000 MCG tablet Commonly known as: CYANOCOBALAMIN Take 3,000 mcg by mouth daily.       Allergies: No Known Allergies  Family History: Family  History  Problem Relation Age of Onset  . Breast cancer Neg Hx     Social History:  reports that she has never smoked. She has never used smokeless tobacco. She reports that she does not drink alcohol or use drugs.  ROS: UROLOGY Frequent Urination?: No Hard to postpone urination?: No Burning/pain with urination?: No Get up at night to urinate?: No Leakage of urine?: No Urine stream starts and stops?: No Trouble starting stream?: No Do you have to strain to urinate?: No Blood in urine?: No Urinary tract infection?: No Sexually transmitted disease?: No Injury to kidneys or bladder?: No Painful intercourse?: No Weak stream?: No Currently pregnant?: No Vaginal bleeding?: No Last menstrual period?: n  Gastrointestinal Nausea?: No Vomiting?: No Indigestion/heartburn?: No Diarrhea?: No Constipation?: No  Constitutional Fever: No Night sweats?: No Weight loss?: No Fatigue?: No  Skin Skin rash/lesions?: No Itching?: No  Eyes Blurred vision?: No Double vision?: No  Ears/Nose/Throat Sore throat?: No Sinus problems?: No  Hematologic/Lymphatic Swollen glands?: No Easy bruising?: No  Cardiovascular Leg swelling?: No Chest pain?: No  Respiratory Cough?: No Shortness of breath?: No  Endocrine Excessive thirst?: No  Musculoskeletal Back pain?: No Joint pain?: No  Neurological Headaches?: No Dizziness?: No  Psychologic Depression?: No Anxiety?: No  Physical Exam: BP 117/61   Pulse 62   Ht 5' 1"  (1.549 m)   Wt 161 lb (73 kg)   BMI 30.42 kg/m   Constitutional:  Alert and oriented, No acute distress. HEENT: [place] AT, moist mucus membranes.  Trachea midline, no masses. Cardiovascular: No clubbing, cyanosis, or edema. Respiratory: Normal respiratory effort, no increased work of breathing. Skin: No rashes, bruises or suspicious lesions. Neurologic: Grossly intact, no focal deficits, moving all 4 extremities. Psychiatric: Normal mood and affect.   Laboratory Data: Lab Results  Component Value Date   WBC 2.4 (L) 08/13/2017   HGB 13.7 08/13/2017   HCT 40.2 08/13/2017   MCV 105.4 (H) 08/13/2017   PLT 83 (L) 08/13/2017    Lab Results  Component Value Date   CREATININE 0.63 08/06/2017    Urinalysis    Component Value Date/Time   COLORURINE AMBER (A) 10/28/2018 0659   APPEARANCEUR CLOUDY (A) 10/28/2018 0659   LABSPEC 1.018 10/28/2018 0659   PHURINE 7.0 10/28/2018 0659   GLUCOSEU NEGATIVE 10/28/2018 0659   HGBUR LARGE (A) 10/28/2018 0659   BILIRUBINUR NEGATIVE 10/28/2018 0659   KETONESUR NEGATIVE 10/28/2018 0659   PROTEINUR 100 (A) 10/28/2018 0659   NITRITE NEGATIVE 10/28/2018 0659   LEUKOCYTESUR NEGATIVE 10/28/2018 0659    Lab Results  Component Value Date   BACTERIA FEW (A) 10/28/2018    Pertinent Imaging: Results for orders placed during the hospital encounter of 10/28/18  CT Renal Stone Study   Narrative CLINICAL DATA:  Left-sided flank pain  EXAM: CT ABDOMEN AND PELVIS WITHOUT CONTRAST  TECHNIQUE: Multidetector CT imaging of the abdomen  and pelvis was performed following the standard protocol without IV contrast.  COMPARISON:  07/29/2016  FINDINGS: Lower chest: No acute abnormality.  Hepatobiliary: Mild nodularity to the liver is noted consistent with underlying cirrhosis. No focal mass is seen. The gallbladder demonstrates cholelithiasis without complicating factors.  Pancreas: Unremarkable. No pancreatic ductal dilatation or surrounding inflammatory changes.  Spleen: Normal in size without focal abnormality.  Adrenals/Urinary Tract: Adrenal glands are within normal limits bilaterally. Kidneys are well visualize without definitive renal calculi. No hydronephrotic changes on the right are seen. Mild hydronephrosis is noted on the left secondary to a 2 mm stone in the proximal to mid ureter. No distal ureteral calculi are seen. The bladder is decompressed.  Stomach/Bowel: Colon is  predominately decompressed. The appendix is within normal limits. No small bowel abnormality is noted. Stomach is unremarkable.  Vascular/Lymphatic: Aortic atherosclerosis. No enlarged abdominal or pelvic lymph nodes.  Reproductive: Uterus and bilateral adnexa are unremarkable.  Other: Minimal changes of anasarca are noted.  Musculoskeletal: Bony structures show degenerative change of lumbar spine.  IMPRESSION: 2 mm proximal to mid left ureteral stone with mild obstructive change.  Changes of cirrhosis of the liver.  Cholelithiasis without complicating factors.   Electronically Signed   By: Inez Catalina M.D.   On: 10/28/2018 10:49    CT personally reviewed today, agree with radiologic interpretation.  Assessment & Plan:    1. Left ureteral stone 2 mm left proximal ureteral stone which is presumably passed given her absence of further flank pain or discomfort and statistical probability of interval stone passage.  CT scan was personally reviewed, she has no additional upper tract stones and has never previously had a stone episode.  We discussed various treatment options including ESWL vs. ureteroscopy, laser lithotripsy, and stent. We discussed the risks and benefits of both including bleeding, infection, damage to surrounding structures, efficacy with need for possible further intervention, and need for temporary ureteral stent.  She was advised to follow-up as needed.  F/u prn  Hollice Espy, MD  [facility] [address], [place], [place] [address] [phone]

## 2018-11-10 NOTE — Addendum Note (Signed)
Addended by: Verlene Mayer A on: 11/10/2018 01:27 PM   Modules accepted: Orders

## 2019-02-08 ENCOUNTER — Telehealth: Payer: Self-pay | Admitting: Adult Health Nurse Practitioner

## 2019-02-08 NOTE — Telephone Encounter (Signed)
Called patient's daughter Bryna Colander, to schedule the Palliative Consult, no answer - left message with reason for call along with my contact information.

## 2019-02-09 ENCOUNTER — Telehealth: Payer: Self-pay | Admitting: Adult Health Nurse Practitioner

## 2019-02-09 NOTE — Telephone Encounter (Signed)
Rec'd message that daughter had returned my call late yesterday.  Called daughter back and after discussing/explaining Palliative services she was in agreement with this.  I have scheduled a Telehealth Zoom Consult for 02/15/19 @ 9 AM.

## 2019-02-15 ENCOUNTER — Other Ambulatory Visit: Payer: Self-pay

## 2019-02-15 ENCOUNTER — Other Ambulatory Visit: Payer: Medicare Other | Admitting: Adult Health Nurse Practitioner

## 2019-02-15 DIAGNOSIS — K7460 Unspecified cirrhosis of liver: Secondary | ICD-10-CM

## 2019-02-15 DIAGNOSIS — Z515 Encounter for palliative care: Secondary | ICD-10-CM

## 2019-02-15 NOTE — Progress Notes (Addendum)
[place] Consult Note Telephone: [phone]  Fax: [phone]  PATIENT NAME: Deanna Escobar DOB: Jan 20, 1951 MRN: [id number]  PRIMARY CARE PROVIDER:   Tracie Harrier, MD  REFERRING PROVIDER: Matthias Hughs NP  RESPONSIBLE PARTY:   Self and husband, Deanna Escobar   H:  [phone]  C:  [name], daughter  [phone]  Due to the COVID-19 crisis, this visit was done via telemedicine and it was initiated and consent by this patient and or family. The patient, daughter, husband and sister were on the Zoom visit.    RECOMMENDATIONS and PLAN:  1.  Advanced care planning.  Patient full code.  Briefly went over ACP and patient and family agreed to have Hard Choices book and MOST form sent for review.  Have appointment in 4 weeks to go over further.  2.  Cirrhosis of the liver.  Patient gets fluid build up in lower abdomen and thighs. Daughter states that this was diagnosed 3-4 years ago.  Was seeing liver specialist at [facility] every 6-9 months for monitoring and now goes every 3 months.  States not a candidate for having the fluid drawn off as where she collects it it would require cuts and she has slow wound healing due to the cirrhosis and they do not want to increase her chances of infection.  She is not a priority candidate for transplant due to her age and comorbidities.  Her liver damage is too extensive for partial transplant. She was in hospital 11/13-11/17/2020 for increased SOB and abdominal swelling. Deanna Escobar has since found combination of torsemide 20 mg BID and spironolactone 25 mg daily that helps to keep the fluid build up down. She is also on lactulose BID. Gets nausea every now and then and gets relief with zofran. She has gotten out of the habit of daily weights but have encouraged her to keep up with the daily weights and to notify provider of any weight increases.  Continue follow up and recommendations by  liver specialist.  3.  Chronic wound ulcers.  Patient has had wounds on bilateral legs for 3 years. 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  This has left some tunneling wound doctor hope that a layer of skin will for over the open areas to help keep infection out.  Has wound nurse from [facility] that comes out twice a week to manage wounds.  Currently is using Unna boots.  Did have ER visit on 02/08/2019 directed by wound nurse concerned about warmth and redness around the wounds.  Was negative for infection or blood clots and was discharged.  Is being followed at wound clinic.  Continue follow up and recommendations by wound doctor.    4.  Functional status.  Patient is ambulatory without assistive devices but does use walker when having pain in legs for balance.  Independent with ADLs.  Is able to help with light cooking but daughter does most of the cooking.  Will continue to assess for any decline and make recommendations as needed.    Patient doing well overall today.  No falls or infections reported. Denies increased SOB or cough, fever, pain, N/V, constipation today. Appetite is good with no known weight changes.  Palliative care will continue to monitor for symptom management and decline and make recommendations as needed.  I spent 50 minutes providing this consultation,  from 9:00 to 9:50. More than 50% of  the time in this consultation was spent coordinating communication.   HISTORY OF PRESENT ILLNESS:  Deanna Escobar is a 68 y.o. year old female with multiple medical problems including nonalcoholic fatty liver disease, cirrhosis of the liver, DMT2, HTN, portal HTN, anxiety. Palliative Care was asked to help address goals of care.   CODE STATUS: See above  PPS: 70% HOSPICE ELIGIBILITY/DIAGNOSIS: TBD  PHYSICAL EXAM:   Deferred   PAST MEDICAL HISTORY:  Past Medical History:  Diagnosis Date   Cancer ([place])    skin ca    Cirrhosis, non-alcoholic ([place])    Diabetes mellitus without complication ([place])    Pt takes Metformin.   GERD (gastroesophageal reflux disease)    Hypertension     SOCIAL HX:  Social History   Tobacco Use   Smoking status: Never Smoker   Smokeless tobacco: Never Used  Substance Use Topics   Alcohol use: No    ALLERGIES: No Known Allergies   PERTINENT MEDICATIONS:  Outpatient Encounter Medications as of 02/15/2019  Medication Sig   alendronate (FOSAMAX) 70 MG tablet Take 70 mg by mouth once a week. Take with a full glass of water on an empty stomach.   candesartan (ATACAND) 16 MG tablet Take 8 mg by mouth daily.    Cholecalciferol 2000 units TABS Take by mouth.   citalopram (CELEXA) 20 MG tablet Take 20 mg by mouth daily.   clobetasol ointment (TEMOVATE) 7.73 % Apply 1 application topically 2 (two) times daily.   ferrous sulfate 325 (65 FE) MG tablet Take 325 mg by mouth daily with breakfast.   furosemide (LASIX) 20 MG tablet Take 40 mg by mouth.    glimepiride (AMARYL) 2 MG tablet Take 2 mg by mouth daily with breakfast.   HM OMEGA-3-6-9 FATTY ACIDS CAPS Take by mouth.   lactulose (CHRONULAC) 10 GM/15ML solution    lisinopril (PRINIVIL,ZESTRIL) 20 MG tablet Take 20 mg by mouth daily.   nadolol (CORGARD) 20 MG tablet Take 20 mg by mouth daily.   omeprazole (PRILOSEC) 40 MG capsule Take 40 mg by mouth daily.   vitamin B-12 (CYANOCOBALAMIN) 1000 MCG tablet Take 3,000 mcg by mouth daily.    No facility-administered encounter medications on file as of 02/15/2019.       Deanna Escobar Jenetta Downer, NP

## 2019-03-15 ENCOUNTER — Other Ambulatory Visit: Payer: Medicare Other | Admitting: Adult Health Nurse Practitioner

## 2019-03-15 ENCOUNTER — Other Ambulatory Visit: Payer: Self-pay

## 2019-03-15 DIAGNOSIS — Z515 Encounter for palliative care: Secondary | ICD-10-CM

## 2019-03-15 DIAGNOSIS — K7460 Unspecified cirrhosis of liver: Secondary | ICD-10-CM

## 2019-03-16 NOTE — Progress Notes (Signed)
[place] Consult Note Telephone: [phone]  Fax: [phone]  PATIENT NAME: Deanna Escobar DOB: [birth year] MRN: [id number]  PRIMARY CARE PROVIDER:   Tracie Harrier, MD  REFERRING PROVIDER:  Matthias Hughs NP  RESPONSIBLE PARTY:   Self and husband, Deanna Escobar   H:  [phone]  C:  [place], daughter  [phone]  Due to the COVID-19 crisis, this visit was done via telemedicine and it was initiated and consent by this patient and or family. The patient, daughter, husband and sister were on the Zoom visit.   RECOMMENDATIONS and PLAN:  1.  Advanced care planning.  Patient has living will and HCPOA paperwork that has been uploaded to [facility].  Her daughter, Deanna Escobar, is named as her health care power of attorney.  2.  Cirrhosis of the liver.  Patient gets fluid build up in lower abdomen and thighs.  Sees liver specialist at [facility] every 3 months.  She is on torsemide 20 mg BID and spironolactone 25 mg daily that helps to keep the fluid build up down.  Did have episode last week when she drank a lot and did have some weeping in her legs.  She states that she get thirsty and is trying to not to drink too much which causes increased edema.  No complaints of increased SOB or cough, increased edema, pain today.  States that when she limits her water intake to 32 ounces a day that her diuretics work better and she will urinate more.  Have encouraged to monitor fluid intake carefully and to use cough drops or hard candy to help with dry mouth.  Have also suggested using Biotene mouth wash for dry mouth as well. Gets nausea every now and then and gets relief with zofran.  Does not have much of an appetite and gets full quickly.  Encouraged to eat small frequent meals and to supplement with Ensure States that she had an episode of pain over the holidays that wasn't completely relieved with Tylenol.  Family concerned that she  will start having more episodes like this and do not want to her to suffer.  Will reach out to referring provider, Matthias Hughs NP, to see if it is okay to have an order of Tramadol on hand for episodes of moderate to severe pain.  Patient also having more cramping that is relieved when she has something potassium, such as orange juice.  Have encouraged to continue supplementing with potassium rich foods to help the cramping.  Will reach out to Matthias Hughs NP for potassium supplement but she may want to check potassium level prior to starting.  Have tried calling the provider's office but was left on hold with no way to leave a message. Will try again later.  3.  Chronic wound ulcers.  Patient has had wounds on bilateral legs for 3 years.  Has wound nurse from [facility] that comes out twice a week to manage wounds.  Is being followed at wound clinic. Continue follow up and recommendations by wound doctor.  Patient has not had any falls, infection, or hospital visits since last visit.  Palliative care will continue to monitor for symptom management and decline and make recommendations as needed. Next appointment is in 4 weeks.     I spent 60 minutes providing this consultation,  from 3:00 to 4:00. More than 50% of the time in this consultation was spent coordinating communication.   HISTORY OF PRESENT ILLNESS:  Deanna Escobar is a 69 y.o. year old female with multiple medical problems including nonalcoholic fatty liver disease, cirrhosis of the liver, DMT2, HTN, portal HTN, anxiety. Palliative Care was asked to help address goals of care.   CODE STATUS: see above  PPS: 70% HOSPICE ELIGIBILITY/DIAGNOSIS: TBD  PHYSICAL EXAM:   Deferred   PAST MEDICAL HISTORY:  Past Medical History:  Diagnosis Date  . Cancer ([place])    skin ca  . Cirrhosis, non-alcoholic ([place])   . Diabetes mellitus without complication ([place])    Pt takes Metformin.  Marland Kitchen GERD (gastroesophageal reflux disease)   .  Hypertension     SOCIAL HX:  Social History   Tobacco Use  . Smoking status: Never Smoker  . Smokeless tobacco: Never Used  Substance Use Topics  . Alcohol use: No    ALLERGIES: No Known Allergies   PERTINENT MEDICATIONS:  Outpatient Encounter Medications as of 03/15/2019  Medication Sig  . alendronate (FOSAMAX) 70 MG tablet Take 70 mg by mouth once a week. Take with a full glass of water on an empty stomach.  . candesartan (ATACAND) 16 MG tablet Take 8 mg by mouth daily.   . Cholecalciferol 2000 units TABS Take by mouth.  . citalopram (CELEXA) 20 MG tablet Take 20 mg by mouth daily.  . clobetasol ointment (TEMOVATE) 1.61 % Apply 1 application topically 2 (two) times daily.  . ferrous sulfate 325 (65 FE) MG tablet Take 325 mg by mouth daily with breakfast.  . furosemide (LASIX) 20 MG tablet Take 40 mg by mouth.   Marland Kitchen glimepiride (AMARYL) 2 MG tablet Take 2 mg by mouth daily with breakfast.  . HM OMEGA-3-6-9 FATTY ACIDS CAPS Take by mouth.  . lactulose (CHRONULAC) 10 GM/15ML solution   . lisinopril (PRINIVIL,ZESTRIL) 20 MG tablet Take 20 mg by mouth daily.  . nadolol (CORGARD) 20 MG tablet Take 20 mg by mouth daily.  Marland Kitchen omeprazole (PRILOSEC) 40 MG capsule Take 40 mg by mouth daily.  . vitamin B-12 (CYANOCOBALAMIN) 1000 MCG tablet Take 3,000 mcg by mouth daily.    No facility-administered encounter medications on file as of 03/15/2019.      Judea Riches Jenetta Downer, NP

## 2019-03-22 ENCOUNTER — Telehealth: Payer: Self-pay

## 2019-03-22 NOTE — Telephone Encounter (Signed)
Late note for 03/15/19: At the direction of Amy NP, phone call placed to Matthias Hughs office to provide update that patient has reported abdominal pain that has not been relieved by tylenol. NP's recommendation is to try tramadol. Patient has also reported an increase in muscle cramping. Amy NP noted that patient is on torsemide with out potassium. Recommendations is for potassium supplement and/or lab work. Office made aware that patient is able to go to [facility] clinic in [place] if needed for labs.

## 2019-04-11 ENCOUNTER — Other Ambulatory Visit: Payer: Self-pay

## 2019-04-11 ENCOUNTER — Other Ambulatory Visit: Payer: Medicare Other | Admitting: Adult Health Nurse Practitioner

## 2019-04-11 DIAGNOSIS — Z515 Encounter for palliative care: Secondary | ICD-10-CM

## 2019-04-11 DIAGNOSIS — K7460 Unspecified cirrhosis of liver: Secondary | ICD-10-CM

## 2019-04-11 NOTE — Progress Notes (Addendum)
[place] Consult Note Telephone: [phone]  Fax: [phone]  PATIENT NAME: Deanna Escobar DOB: 06/25/1950 MRN: [id number]  PRIMARY CARE PROVIDER:   Tracie Harrier, MD  REFERRING PROVIDER:  Matthias Hughs NP  RESPONSIBLE PARTY:    Self and husband, Deanna Escobar  H: [phone] C: [place], daughter [phone]  Due to the COVID-19 crisis, this visit was done via telemedicine and it was initiated and consent by this patient and or family.The patient, daughter, husband and sister were on the Zoom visit.    RECOMMENDATIONS and PLAN:  1.  Advanced care planning.  Patient has living will and HCPOA paperwork that has been uploaded to [facility].  Her daughter, Deanna Escobar, is named as her health care power of attorney.  2.  Cirrhosis of the liver.  Patient is having more fluid buildup despite use of diuretics.  Potassium and magnesium stay low so no changes were made to increase the diuretic dosages.  Patient states that she has woken one morning last week and had edema in right breast.  Daughter states that the edema is in her legs and hips today.  She has chronic ulcers related to the edema and daughter states that she does have weeping in her legs.  Home health nurse manages wound care under direction of wound clinic.  Patient's weight on 04/07/2019 was 166.  Daughter states that she has gained 10 pounds of fluid but can tell that her mother is losing weight because her appetite is decreasing and she is eating less.  Patient is weaker and is sleeping more.  At last appointment on 04/07/2019 was told that she could consider liver transplant.  This has confused patient and family as they were told months ago that she was not a candidate and now that she is weaker and more frail is told that it is something to consider.  Patient has been waffling back and forth about this decision and has not decided just yet. If she  agrees she would have to be evaluated before being added to waiting list and is aware that at this point she may not be a candidate. Discussed pros and cons.  Have appointment in person next week and will discuss her decision and what next steps will be based on her decision.    Patient has been having a low grade fever and daughter had questions about when to take her to the hospital.  Did instruct that if she has difficulty breathing, persistent fever, or other signs of COVID to get evaluated at the hospital.  Patient has not had any falls, infection, or hospital visits since last visit.  Palliative care will continue to monitor for symptom management and decline and make recommendations as needed.  I spent 60 minutes providing this consultation,  including time with patient/family, chart review, provider coordination, and documentation. More than 50% of the time in this consultation was spent coordinating communication.   HISTORY OF PRESENT ILLNESS:  Deanna Escobar is a 69 y.o. year old female with multiple medical problems including nonalcoholic fatty liver disease, cirrhosis of the liver, DMT2, HTN, portal HTN, anxiety. Palliative Care was asked to help address goals of care.   CODE STATUS: see above  PPS: 50% HOSPICE ELIGIBILITY/DIAGNOSIS: TBD  PHYSICAL EXAM:   Deferred  PAST MEDICAL HISTORY:  Past Medical History:  Diagnosis Date  . Cancer ([place])    skin ca  . Cirrhosis, non-alcoholic ([place])   . Diabetes mellitus  without complication ([place])    Pt takes Metformin.  Marland Kitchen GERD (gastroesophageal reflux disease)   . Hypertension     SOCIAL HX:  Social History   Tobacco Use  . Smoking status: Never Smoker  . Smokeless tobacco: Never Used  Substance Use Topics  . Alcohol use: No    ALLERGIES: No Known Allergies   PERTINENT MEDICATIONS:  Outpatient Encounter Medications as of 04/11/2019  Medication Sig  . alendronate (FOSAMAX) 70 MG tablet Take 70 mg by mouth once a week. Take with  a full glass of water on an empty stomach.  . candesartan (ATACAND) 16 MG tablet Take 8 mg by mouth daily.   . Cholecalciferol 2000 units TABS Take by mouth.  . citalopram (CELEXA) 20 MG tablet Take 20 mg by mouth daily.  . clobetasol ointment (TEMOVATE) 7.35 % Apply 1 application topically 2 (two) times daily.  . ferrous sulfate 325 (65 FE) MG tablet Take 325 mg by mouth daily with breakfast.  . furosemide (LASIX) 20 MG tablet Take 40 mg by mouth.   Marland Kitchen glimepiride (AMARYL) 2 MG tablet Take 2 mg by mouth daily with breakfast.  . HM OMEGA-3-6-9 FATTY ACIDS CAPS Take by mouth.  . lactulose (CHRONULAC) 10 GM/15ML solution   . lisinopril (PRINIVIL,ZESTRIL) 20 MG tablet Take 20 mg by mouth daily.  . nadolol (CORGARD) 20 MG tablet Take 20 mg by mouth daily.  Marland Kitchen omeprazole (PRILOSEC) 40 MG capsule Take 40 mg by mouth daily.  . vitamin B-12 (CYANOCOBALAMIN) 1000 MCG tablet Take 3,000 mcg by mouth daily.    No facility-administered encounter medications on file as of 04/11/2019.      Aashritha Miedema Jenetta Downer, NP

## 2019-04-20 ENCOUNTER — Other Ambulatory Visit: Payer: Medicare Other | Admitting: Adult Health Nurse Practitioner

## 2019-04-21 ENCOUNTER — Other Ambulatory Visit: Payer: Self-pay

## 2019-04-21 ENCOUNTER — Other Ambulatory Visit: Payer: Medicare Other | Admitting: Adult Health Nurse Practitioner

## 2019-04-21 DIAGNOSIS — Z515 Encounter for palliative care: Secondary | ICD-10-CM

## 2019-04-21 DIAGNOSIS — K7460 Unspecified cirrhosis of liver: Secondary | ICD-10-CM

## 2019-04-21 NOTE — Progress Notes (Signed)
[place] Consult Note Telephone: [phone]  Fax: [phone]  PATIENT NAME: Deanna Escobar DOB: [birth year] MRN: [id number]  PRIMARY CARE PROVIDER:   Tracie Harrier, MD  REFERRING PROVIDER: Matthias Hughs NP  RESPONSIBLE PARTY:   Self and husband, Deanna Escobar  H: [phone] C: [name], daughter [phone]    RECOMMENDATIONS and PLAN:  1.  Advanced care planning. Patient has living will and HCPOA paperwork that has been uploaded to [facility]. Her daughter, Deanna Escobar, is named as her health care power of attorney.  2.  Cirrhosis of the liver.  Patient has significant edema as noted below.  Daughter does state that despite use of diuretics she is urinating less. She is also on lactulose 10g/42m BID and has been only having one bowel movement a day.  Daughter states that prior to lactulose it was not unusual for patient to have 4-6 BMs a day.  Original dose of lactulose had to be reduced due her having too many BMs. Will reach out to SMatthias HughsNP and [facility] with [facility] concerns.   Patient has decided to go through the evaluation for liver transplant.  Will be with her and her family during this process for support.  3.  Pain.  Patient is having increased pain due to increased edema in her legs.  Does get relief with the Tramadol 50 mg Q8 hrs.  Does not take it consistently but states the pain has been getting worse.  Have encouraged her to take more often to stay ahead of the pain.  Family does have concerns that the Tramadol may be stopped if they ask for it more often.  Educated that she is taking it appropriately and as her disease progresses she may need it more often or have to switch to something stronger.  Instructed that we will reassess pain and make sure she has what she needs to be comfortable.  Will continue to monitor pain and make adjustments to pain meds as  needed.  4.  Chronic ulcers.  Patient is having to have her bandages daily due to the increased weeping.  Family is noticing more blood on her bandages with the weeping.  She is being followed at wound clinic and has home health nurse that comes twice a week to change bandages.  Continue follow up and recommendations by wound clinic.  5.  Support.  Patient is having a lot of emotions going through evaluation for transplant.  She has support from her family.  She and her family are encouraged to call with any new concerns or questions.  Encouraged family and patient that palliative team will be here to support them during this process.  Will follow up every two weeks during this process for support and possible transition of care if not eligible for transplant.  I spent 50 minutes providing this consultation,  including time with patient/family, chart review, provider coordination, and documentation. More than 50% of the time in this consultation was spent coordinating communication.   HISTORY OF PRESENT ILLNESS:  Deanna TAMAYOis a 69y.o. year old female with multiple medical problems including nonalcoholic fatty liver disease, cirrhosis of the liver, DMT2, HTN, portal HTN, anxiety. Palliative Care was asked to help address goals of care.   CODE STATUS: see above  PPS: 50% HOSPICE ELIGIBILITY/DIAGNOSIS: TBD  PHYSICAL EXAM:  BP 134/68  HR  109  O2 95% on RA General: NAD, frail appearing,  thin Cardiovascular:tachycardic; regular rate and rhythm Pulmonary: lung sounds clear; normal respiratory effort Abdomen: distended due to ascites Extremities: significant edema into legs, hips, buttocks, breasts. Unna boots in place to bilateral legs Neurological: Weakness but otherwise nonfocal; has confusion and forgetfulness at times   PAST MEDICAL HISTORY:  Past Medical History:  Diagnosis Date  . Cancer ([place])    skin ca  . Cirrhosis, non-alcoholic ([place])   . Diabetes mellitus without complication  ([place])    Pt takes Metformin.  Marland Kitchen GERD (gastroesophageal reflux disease)   . Hypertension     SOCIAL HX:  Social History   Tobacco Use  . Smoking status: Never Smoker  . Smokeless tobacco: Never Used  Substance Use Topics  . Alcohol use: No    ALLERGIES: No Known Allergies   PERTINENT MEDICATIONS:  Outpatient Encounter Medications as of 04/21/2019  Medication Sig  . alendronate (FOSAMAX) 70 MG tablet Take 70 mg by mouth once a week. Take with a full glass of water on an empty stomach.  . candesartan (ATACAND) 16 MG tablet Take 8 mg by mouth daily.   . Cholecalciferol 2000 units TABS Take by mouth.  . citalopram (CELEXA) 20 MG tablet Take 20 mg by mouth daily.  . clobetasol ointment (TEMOVATE) 4.40 % Apply 1 application topically 2 (two) times daily.  . ferrous sulfate 325 (65 FE) MG tablet Take 325 mg by mouth daily with breakfast.  . furosemide (LASIX) 20 MG tablet Take 40 mg by mouth.   Marland Kitchen glimepiride (AMARYL) 2 MG tablet Take 2 mg by mouth daily with breakfast.  . HM OMEGA-3-6-9 FATTY ACIDS CAPS Take by mouth.  . lactulose (CHRONULAC) 10 GM/15ML solution   . lisinopril (PRINIVIL,ZESTRIL) 20 MG tablet Take 20 mg by mouth daily.  . nadolol (CORGARD) 20 MG tablet Take 20 mg by mouth daily.  Marland Kitchen omeprazole (PRILOSEC) 40 MG capsule Take 40 mg by mouth daily.  . vitamin B-12 (CYANOCOBALAMIN) 1000 MCG tablet Take 3,000 mcg by mouth daily.    No facility-administered encounter medications on file as of 04/21/2019.     Kollen Armenti Jenetta Downer, NP

## 2019-04-22 ENCOUNTER — Telehealth: Payer: Self-pay

## 2019-04-22 NOTE — Telephone Encounter (Signed)
At the direction of Deanna Escobar, phone call placed to the office of Matthias Hughs Escobar to report that patient has increasing edema and continues on prescribed lasix. Patient is having decreased urination. Spoke with Magdalen Spatz. Will fax Escobar note

## 2019-04-22 NOTE — Telephone Encounter (Signed)
At the direction of Amy NP, phone call placed to [facility] office. Spoke with Danton Clap and provided update on patient that continues on lactulose but has had minimal bowel movements and inquired if provider would like to make adjustment to medications. Alice stated that patient has an appointment with Dr. Angela Adam on 05/19/2019. Will also fax notes

## 2019-04-25 ENCOUNTER — Telehealth: Payer: Self-pay | Admitting: Adult Health Nurse Practitioner

## 2019-04-25 NOTE — Telephone Encounter (Signed)
Had received email from daughter about worsening pain related to continued fluid build up.  Had RN navigator fax update on patient's condition to Dr. Vernon Prey and Dr. Bayard Males. Did respond to daughter's email and advised that if pain increased or if she was having difficulty breathing to go to ER for fluid management. Encouraged to call with any questions or concerns. Kamille Toomey K. Olena Heckle NP

## 2019-05-02 ENCOUNTER — Telehealth: Payer: Self-pay | Admitting: Adult Health Nurse Practitioner

## 2019-05-02 MED ORDER — FENTANYL CITRATE (PF) 50 MCG/ML IJ SOLN
12.50 | INTRAMUSCULAR | Status: DC
Start: ? — End: 2019-05-02

## 2019-05-02 MED ORDER — RIFAXIMIN 550 MG PO TABS
550.00 | ORAL_TABLET | ORAL | Status: DC
Start: 2019-05-02 — End: 2019-05-02

## 2019-05-02 MED ORDER — GENERIC EXTERNAL MEDICATION
20.00 | Status: DC
Start: 2019-05-02 — End: 2019-05-02

## 2019-05-02 MED ORDER — GENERIC EXTERNAL MEDICATION
1.00 | Status: DC
Start: 2019-05-03 — End: 2019-05-02

## 2019-05-02 MED ORDER — GENERIC EXTERNAL MEDICATION
50.00 | Status: DC
Start: ? — End: 2019-05-02

## 2019-05-02 MED ORDER — ZINC SULFATE 220 (50 ZN) MG PO CAPS
220.00 | ORAL_CAPSULE | ORAL | Status: DC
Start: 2019-05-03 — End: 2019-05-02

## 2019-05-02 MED ORDER — LACTULOSE 10 GM/15ML PO SOLN
30.00 | ORAL | Status: DC
Start: 2019-05-02 — End: 2019-05-02

## 2019-05-02 MED ORDER — ONDANSETRON HCL 4 MG/2ML IJ SOLN
4.00 | INTRAMUSCULAR | Status: DC
Start: ? — End: 2019-05-02

## 2019-05-02 MED ORDER — GENERIC EXTERNAL MEDICATION
40.00 | Status: DC
Start: 2019-05-05 — End: 2019-05-02

## 2019-05-02 MED ORDER — GENERIC EXTERNAL MEDICATION
25.00 | Status: DC
Start: ? — End: 2019-05-02

## 2019-05-02 NOTE — Telephone Encounter (Signed)
Spoke with daughter about patient being in the hospital.  Stated that she was admitted to [facility] on 04/27/2019 due to her cirrhosis with ascites and acute renal failure.  She has been vomiting blood of unknown origin.  She had 2 blood transfusions last week and 3 yesterday.  Family has decided on no more blood transfusions or testing.  They just want to make her comfortable.  They are unsure if she is going to be able to come home under her present condition.  Would not mind having someone from [facility] to reach out to them for support. Bascom Biel K. Olena Heckle NP

## 2019-05-05 MED ORDER — GLYCOPYRROLATE 0.4 MG/2ML IJ SOLN
0.20 | INTRAMUSCULAR | Status: DC
Start: ? — End: 2019-05-05

## 2019-05-05 MED ORDER — LORAZEPAM 2 MG/ML IJ SOLN
1.00 | INTRAMUSCULAR | Status: DC
Start: ? — End: 2019-05-05

## 2019-05-09 DEATH — deceased

## 2019-11-28 IMAGING — MG DIGITAL SCREENING BILATERAL MAMMOGRAM WITH TOMO AND CAD
8 series · 8 of 24 positions shown · non-contrast
Comparison: Previous exam(s).

CLINICAL DATA: Screening.

EXAM:
DIGITAL SCREENING BILATERAL MAMMOGRAM WITH TOMO AND CAD

[R MLO synth-2D]
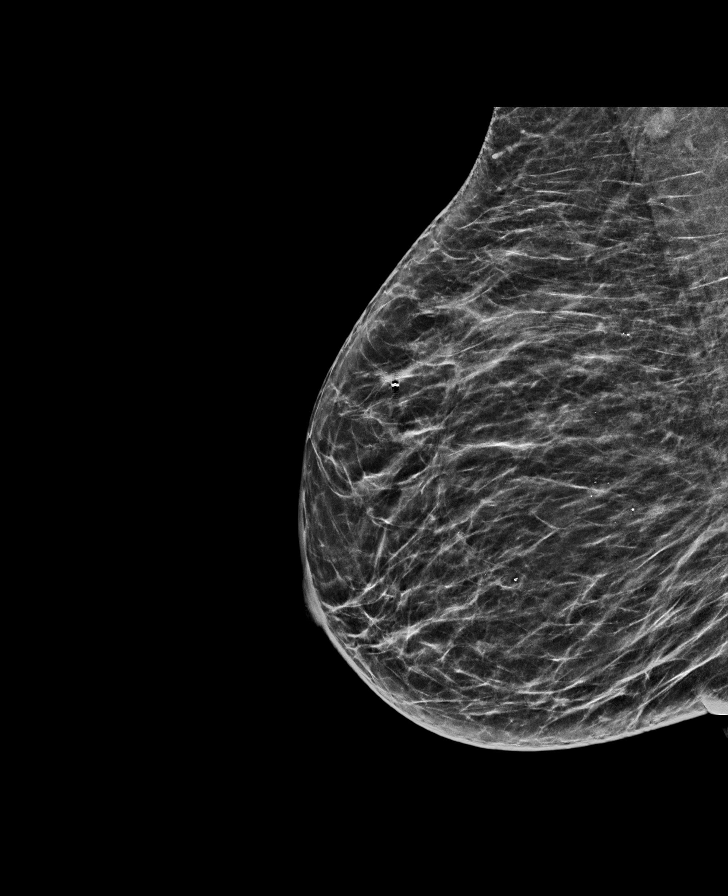

[L CC synth-2D]
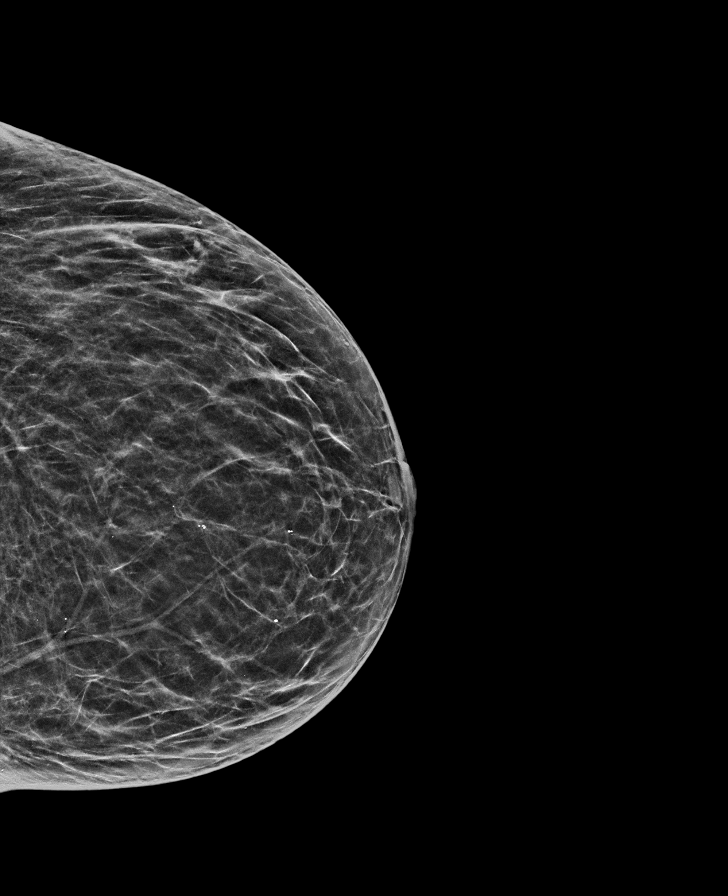

[L MLO synth-2D]
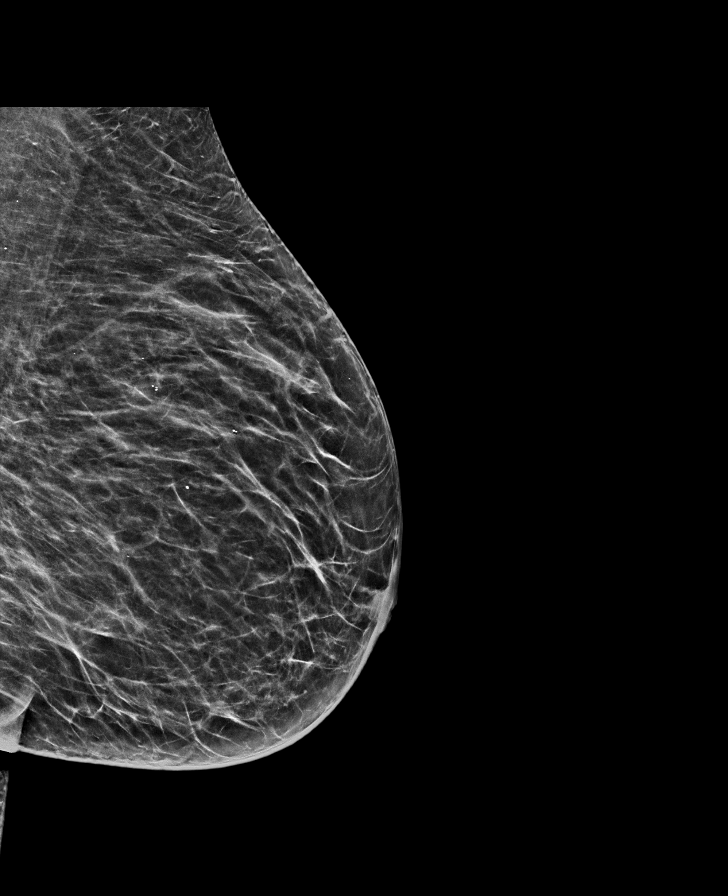

[R CC synth-2D]
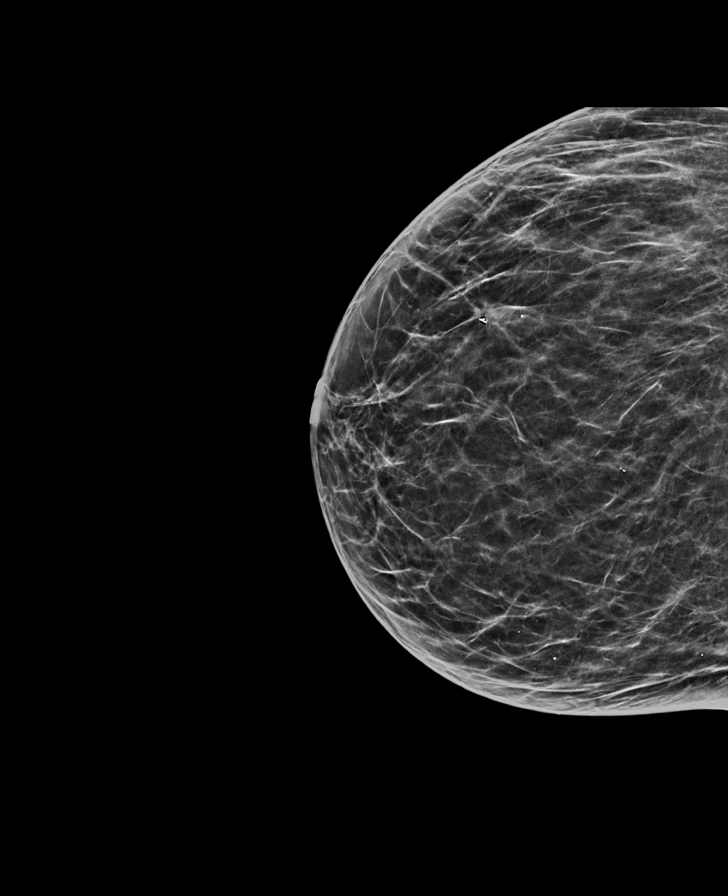

[L CC tomo · tomo slice 27/54.0]
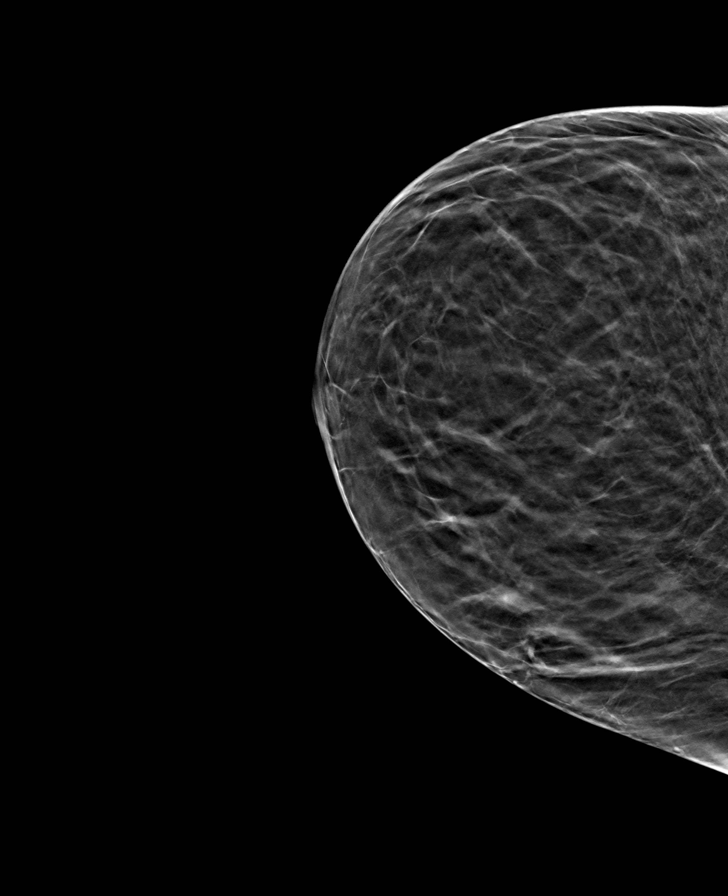

[L MLO tomo · tomo slice 31/60.0]
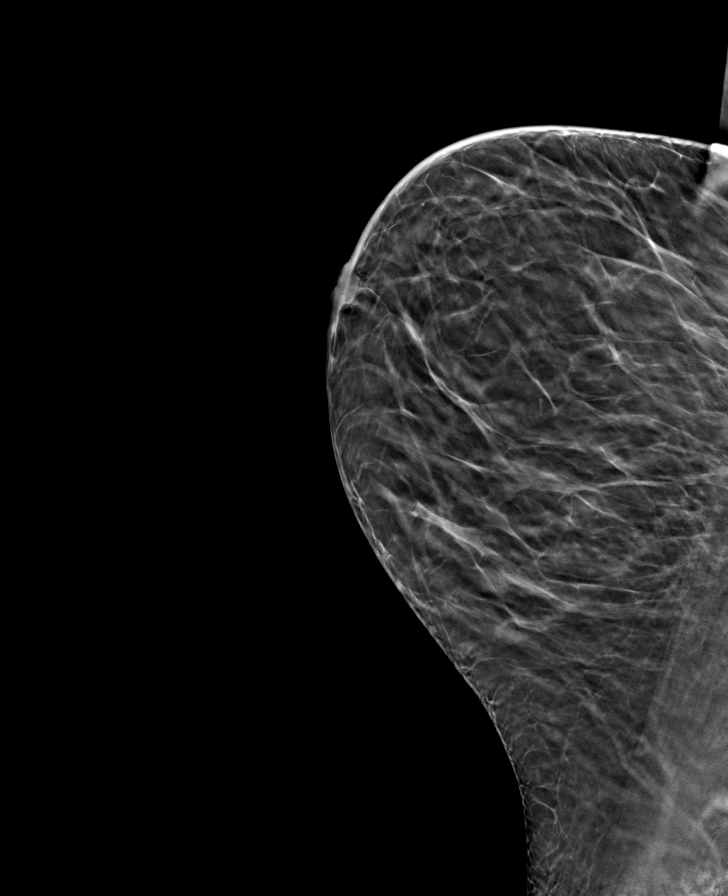

[R CC tomo · tomo slice 30/59.0]
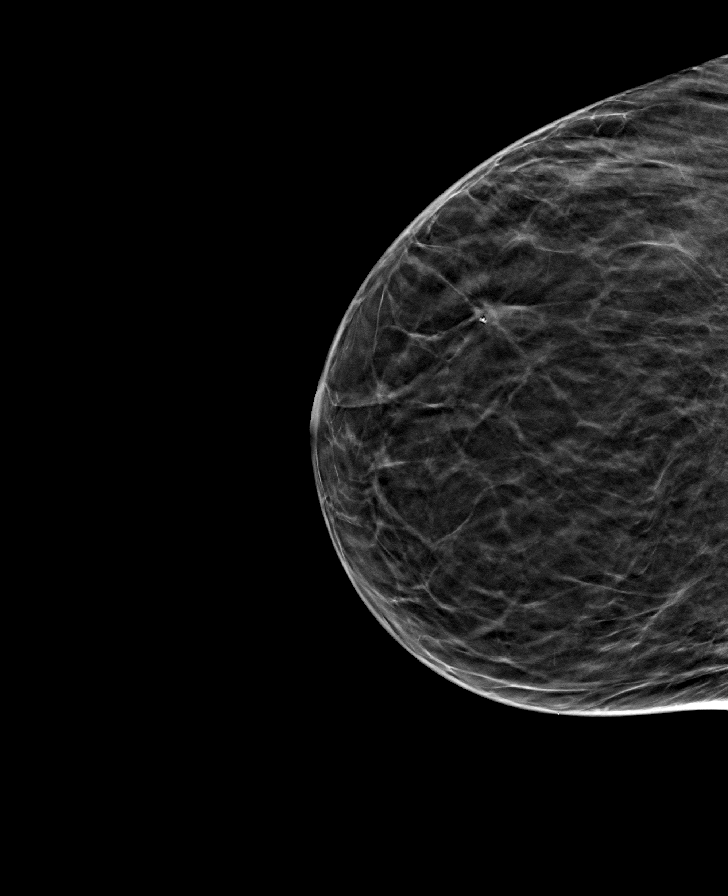

[R MLO tomo · tomo slice 30/59.0]
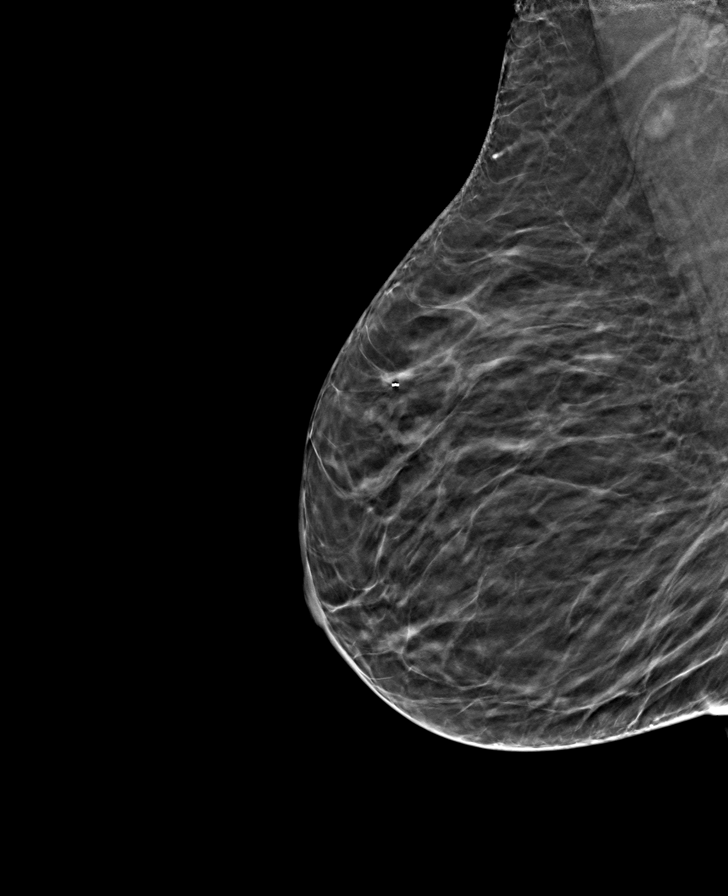

[8 of 24 positions shown; findings below may reference images not displayed]

ACR Breast Density Category b: There are scattered areas of
fibroglandular density.
FINDINGS: There are no findings suspicious for malignancy. Images were
processed with CAD.
IMPRESSION: No mammographic evidence of malignancy. A result letter of this
screening mammogram will be mailed directly to the patient.

RECOMMENDATION:
Screening mammogram in one year. (Code:[id number])

BI-RADS CATEGORY  1: Negative.
